# Patient Record
Sex: Female | Born: 1980 | Hispanic: No | Marital: Married | Smoking: Never smoker
Health system: Southern US, Community
[De-identification: ages and names within clinical notes are randomized; demographics above are authoritative.]

## PROBLEM LIST (undated history)

## (undated) DIAGNOSIS — K529 Noninfective gastroenteritis and colitis, unspecified: Secondary | ICD-10-CM

## (undated) DIAGNOSIS — E282 Polycystic ovarian syndrome: Secondary | ICD-10-CM

## (undated) DIAGNOSIS — K219 Gastro-esophageal reflux disease without esophagitis: Secondary | ICD-10-CM

## (undated) HISTORY — DX: Polycystic ovarian syndrome: E28.2

## (undated) HISTORY — DX: Noninfective gastroenteritis and colitis, unspecified: K52.9

## (undated) HISTORY — PX: APPENDECTOMY: SHX54

---

## 2014-03-11 HISTORY — PX: HYSTEROSCOPY: SHX211

## 2015-02-23 ENCOUNTER — Emergency Department
Admission: EM | Admit: 2015-02-23 | Discharge: 2015-02-23 | Disposition: A | Discharge status: Home / Self Care | Payer: Self-pay | Attending: Emergency Medicine | Admitting: Emergency Medicine

## 2015-02-23 ENCOUNTER — Encounter: Payer: Self-pay | Admitting: Emergency Medicine

## 2015-02-23 ENCOUNTER — Emergency Department: Payer: Self-pay

## 2015-02-23 DIAGNOSIS — R1013 Epigastric pain: Secondary | ICD-10-CM

## 2015-02-23 DIAGNOSIS — K219 Gastro-esophageal reflux disease without esophagitis: Secondary | ICD-10-CM | POA: Insufficient documentation

## 2015-02-23 DIAGNOSIS — Z3A01 Less than 8 weeks gestation of pregnancy: Secondary | ICD-10-CM | POA: Insufficient documentation

## 2015-02-23 DIAGNOSIS — O99331 Smoking (tobacco) complicating pregnancy, first trimester: Secondary | ICD-10-CM | POA: Insufficient documentation

## 2015-02-23 DIAGNOSIS — O99611 Diseases of the digestive system complicating pregnancy, first trimester: Secondary | ICD-10-CM | POA: Insufficient documentation

## 2015-02-23 DIAGNOSIS — R197 Diarrhea, unspecified: Secondary | ICD-10-CM | POA: Insufficient documentation

## 2015-02-23 DIAGNOSIS — O210 Mild hyperemesis gravidarum: Secondary | ICD-10-CM | POA: Insufficient documentation

## 2015-02-23 DIAGNOSIS — F17200 Nicotine dependence, unspecified, uncomplicated: Secondary | ICD-10-CM | POA: Insufficient documentation

## 2015-02-23 HISTORY — DX: Gastro-esophageal reflux disease without esophagitis: K21.9

## 2015-02-23 LAB — CBC WITH DIFFERENTIAL/PLATELET
BASOS ABS: 0 10*3/uL (ref 0–0.1)
Basophils Relative: 0 %
EOS PCT: 0 %
Eosinophils Absolute: 0 10*3/uL (ref 0–0.7)
HEMATOCRIT: 36.8 % (ref 35.0–47.0)
Hemoglobin: 12.5 g/dL (ref 12.0–16.0)
LYMPHS PCT: 30 %
Lymphs Abs: 1.6 10*3/uL (ref 1.0–3.6)
MCH: 31.7 pg (ref 26.0–34.0)
MCHC: 34 g/dL (ref 32.0–36.0)
MCV: 93.4 fL (ref 80.0–100.0)
Monocytes Absolute: 0.4 10*3/uL (ref 0.2–0.9)
Monocytes Relative: 7 %
NEUTROS ABS: 3.3 10*3/uL (ref 1.4–6.5)
NEUTROS PCT: 63 %
PLATELETS: 211 10*3/uL (ref 150–440)
RBC: 3.94 MIL/uL (ref 3.80–5.20)
RDW: 12.6 % (ref 11.5–14.5)
WBC: 5.2 10*3/uL (ref 3.6–11.0)

## 2015-02-23 LAB — COMPREHENSIVE METABOLIC PANEL
ALT: 13 U/L — AB (ref 14–54)
AST: 22 U/L (ref 15–41)
Albumin: 4.1 g/dL (ref 3.5–5.0)
Alkaline Phosphatase: 39 U/L (ref 38–126)
Anion gap: 6 (ref 5–15)
BUN: 11 mg/dL (ref 6–20)
CHLORIDE: 106 mmol/L (ref 101–111)
CO2: 24 mmol/L (ref 22–32)
CREATININE: 0.47 mg/dL (ref 0.44–1.00)
Calcium: 9.1 mg/dL (ref 8.9–10.3)
GFR calc Af Amer: >60 mL/min (ref >60)
Glucose, Bld: 93 mg/dL (ref 65–99)
Potassium: 4 mmol/L (ref 3.5–5.1)
Sodium: 136 mmol/L (ref 135–145)
Total Bilirubin: 1.2 mg/dL (ref 0.3–1.2)
Total Protein: 7.7 g/dL (ref 6.5–8.1)

## 2015-02-23 LAB — URINALYSIS COMPLETE WITH MICROSCOPIC (ARMC ONLY)
BILIRUBIN URINE: NEGATIVE
Glucose, UA: NEGATIVE mg/dL
Hgb urine dipstick: NEGATIVE
Ketones, ur: NEGATIVE mg/dL
Leukocytes, UA: NEGATIVE
Nitrite: NEGATIVE
PH: 8 (ref 5.0–8.0)
PROTEIN: NEGATIVE mg/dL
RBC / HPF: NONE SEEN RBC/hpf (ref 0–5)
Specific Gravity, Urine: 1.013 (ref 1.005–1.030)

## 2015-02-23 LAB — LIPASE, BLOOD: LIPASE: 23 U/L (ref 11–51)

## 2015-02-23 MED ORDER — GI COCKTAIL ~~LOC~~: 30.0000 mL | Freq: Once | ORAL | Status: DC

## 2015-02-23 MED ORDER — PROMETHAZINE HCL 12.5 MG PO TABS: 12.5000 mg | ORAL_TABLET | Freq: Four times a day (QID) | ORAL | Status: AC | PRN

## 2015-02-23 MED ORDER — DICYCLOMINE HCL 20 MG PO TABS: 20.0000 mg | ORAL_TABLET | Freq: Three times a day (TID) | ORAL | Status: DC | PRN

## 2015-02-23 MED ORDER — PANTOPRAZOLE SODIUM 20 MG PO TBEC: 20.0000 mg | DELAYED_RELEASE_TABLET | Freq: Every day | ORAL | Status: DC

## 2015-02-23 MED ORDER — ONDANSETRON 4 MG PO TBDP
4.0000 mg | ORAL_TABLET | Freq: Once | ORAL | Status: DC
Start: 2015-02-23 — End: 2015-02-23

## 2015-02-23 NOTE — ED Notes (Signed)
EDP at bedside  

## 2015-02-23 NOTE — ED Notes (Signed)
Unable to access signature screen to have patient sign for dc instructions because it is in read-only mode.

## 2015-02-23 NOTE — ED Notes (Signed)
Pt transported to [facility]

## 2015-02-23 NOTE — ED Notes (Signed)
Pt presents with epigastric pain for ten days now. States certain foods make it worse, pt with hx of gerd. Pt is currently two weeks pregnant and was seen earlier in the week by pcp and given ranitidine to take. Pt states is not any better.

## 2015-02-23 NOTE — ED Notes (Signed)
Given crackers and water ?

## 2015-02-23 NOTE — ED Provider Notes (Signed)
Time Seen: Approximately ----------------------------------------- 12:32 PM on 02/23/2015 -----------------------------------------    I have reviewed the triage notes  Chief Complaint: Abdominal Pain   History of Present Illness: Christina Blankenship is a 34 y.o. female who presents with acute exacerbation of what sounds like chronic intermittent epigastric pain. Patient was recently diagnosed in her first trimester of pregnancy approximately 2-3 weeks and was referred to OB/GYN. She states she's had a history of colitis and points to the epigastric area as the source of her discomfort. She states she also has some loose stool with no melena or hematochezia. Patient denies any fever or chills back or flank discomfort. She has had some nausea and some reflux-type symptoms.   Past Medical History  Diagnosis Date  . GERD (gastroesophageal reflux disease)     There are no active problems to display for this patient.   Past Surgical History  Procedure Laterality Date  . Appendectomy      Past Surgical History  Procedure Laterality Date  . Appendectomy      No current outpatient prescriptions on file.  Allergies:  Tramadol  Family History: No family history on file.  Social History: Social History  Substance Use Topics  . Smoking status: Current Every Day Smoker  . Smokeless tobacco: None  . Alcohol Use: No     Review of Systems:   10 point review of systems was performed and was otherwise negative:  Constitutional: No fever Eyes: No visual disturbances ENT: No sore throat, ear pain Cardiac: No chest pain Respiratory: No shortness of breath, wheezing, or stridor Abdomen: Abdominal pains exclusively in the epigastric area and seems to wax and wane in its intensity. Endocrine: No weight loss, No night sweats Extremities: No peripheral edema, cyanosis Skin: No rashes, easy bruising Neurologic: No focal weakness, trouble with speech or swollowing Urologic:  No dysuria, Hematuria, or urinary frequency   Physical Exam:  ED Triage Vitals  Enc Vitals Group     BP 02/23/15 1129 106/54 mmHg     Pulse Rate 02/23/15 1129 93     Resp 02/23/15 1129 18     Temp 02/23/15 1129 98 F (36.7 C)     Temp Source 02/23/15 1129 Oral     SpO2 02/23/15 1129 100 %     Weight 02/23/15 1129 123 lb (55.792 kg)     Height 02/23/15 1129 5\' 5"  (1.651 m)     Head Cir --      Peak Flow --      Pain Score 02/23/15 1129 6     Pain Loc --      Pain Edu? --      Excl. in GC? --     General: Awake , Alert , and Oriented times 3; GCS 15 Head: Normal cephalic , atraumatic Eyes: Pupils equal , round, reactive to light Nose/Throat: No nasal drainage, patent upper airway without erythema or exudate.  Neck: Supple, Full range of motion, No anterior adenopathy or palpable thyroid masses Lungs: Clear to ascultation without wheezes , rhonchi, or rales Heart: Regular rate, regular rhythm without murmurs , gallops , or rubs Abdomen: Mild tenderness to deep palpation without rebound, guarding , or rigidity; bowel sounds positive and symmetric in all 4 quadrants. No organomegaly .        Extremities: 2 plus symmetric pulses. No edema, clubbing or cyanosis Neurologic: normal ambulation, Motor symmetric without deficits, sensory intact Skin: warm, dry, no rashes   Labs:   All laboratory work was  reviewed including any pertinent negatives or positives listed below:  Labs Reviewed  CBC WITH DIFFERENTIAL/PLATELET  COMPREHENSIVE METABOLIC PANEL  LIPASE, BLOOD  URINALYSIS COMPLETEWITH MICROSCOPIC ([facility] ONLY)       Radiology:   CLINICAL DATA: Epigastric abdominal pain for 10 days.  EXAM: US ABDOMEN LIMITED - RIGHT UPPER QUADRANT  COMPARISON: None.  FINDINGS: Gallbladder:  No gallstones or wall thickening visualized. No sonographic Murphy sign noted.  Common bile duct:  Diameter: 3.6 mm which is within normal limits.  Liver:  No focal lesion  identified. Within normal limits in parenchymal echogenicity.  IMPRESSION: No abnormality seen in the right upper quadrant of the abdomen.    I personally reviewed the radiologic studies     ED Course: Differential diagnosis includes but is not exclusive to acute cholecystitis, intrathoracic causes for epigastric abdominal pain, gastritis, duodenitis, pancreatitis, small bowel or large bowel obstruction, abdominal aortic aneurysm, hernia, gastritis, etc. Given the patient's current clinical presentation and objective findings I felt she had some colitis and/or gastroenteritis. She also describes some reflux-type symptoms associated with a first trimester pregnancy. She has no pregnancy related complaints at this time such as vaginal bleeding or lower abdominal pain. Patient's workup included an ultrasound which did not show any signs of biliary colic and has some mild pain over the epigastric area. I felt was unlikely she had a bleeding ulcer the patient was referred to OB/GYN and was placed on Protonix as she states her ranitidine has not been working. He is advised take over-the-counter Tylenol for pain and return here if she develops any melena, hematochezia, fever or any other new concerns. I advised her that the loose stool likely resolve itself out at this point I felt was unlikely it was related to the meal that she had last week.    Assessment: * First trimester pregnancy Esophageal reflux disease Nausea, vomiting and diarrhea     Plan:  Outpatient management Patient was advised to return immediately if condition worsens. Patient was advised to follow up with their primary care physician or other specialized physicians involved in their outpatient care            Jennye Moccasin, MD 02/23/15 1442

## 2015-02-23 NOTE — ED Notes (Signed)
Pt returned from US

## 2015-03-22 ENCOUNTER — Encounter: Payer: Self-pay | Admitting: Obstetrics & Gynecology

## 2015-03-22 ENCOUNTER — Ambulatory Visit (INDEPENDENT_AMBULATORY_CARE_PROVIDER_SITE_OTHER): Payer: BLUE CROSS/BLUE SHIELD | Admitting: Obstetrics & Gynecology

## 2015-03-22 VITALS — BP 95/61 | HR 89 | Ht 66.0 in | Wt 120.0 lb

## 2015-03-22 DIAGNOSIS — Z3401 Encounter for supervision of normal first pregnancy, first trimester: Secondary | ICD-10-CM

## 2015-03-22 DIAGNOSIS — Z124 Encounter for screening for malignant neoplasm of cervix: Secondary | ICD-10-CM | POA: Diagnosis not present

## 2015-03-22 DIAGNOSIS — Z3400 Encounter for supervision of normal first pregnancy, unspecified trimester: Secondary | ICD-10-CM | POA: Insufficient documentation

## 2015-03-22 DIAGNOSIS — Z36 Encounter for antenatal screening of mother: Secondary | ICD-10-CM | POA: Diagnosis not present

## 2015-03-22 DIAGNOSIS — O09529 Supervision of elderly multigravida, unspecified trimester: Secondary | ICD-10-CM | POA: Insufficient documentation

## 2015-03-22 DIAGNOSIS — Z113 Encounter for screening for infections with a predominantly sexual mode of transmission: Secondary | ICD-10-CM

## 2015-03-22 DIAGNOSIS — K529 Noninfective gastroenteritis and colitis, unspecified: Secondary | ICD-10-CM

## 2015-03-22 DIAGNOSIS — Z1151 Encounter for screening for human papillomavirus (HPV): Secondary | ICD-10-CM | POA: Diagnosis not present

## 2015-03-22 DIAGNOSIS — Z23 Encounter for immunization: Secondary | ICD-10-CM | POA: Diagnosis not present

## 2015-03-22 DIAGNOSIS — O09511 Supervision of elderly primigravida, first trimester: Secondary | ICD-10-CM

## 2015-03-22 MED ORDER — OMEPRAZOLE 20 MG PO CPDR: 20.0000 mg | DELAYED_RELEASE_CAPSULE | Freq: Every day | ORAL | Status: DC

## 2015-03-22 NOTE — Progress Notes (Signed)
Bedside ultrasound today measured 78100w4d fetus with heartbeat.

## 2015-03-22 NOTE — Progress Notes (Signed)
   Subjective:    Christina Blankenship is a Dorrene GermanM [place] G1P0 5832w4d being seen today for her first obstetrical visit.  Her obstetrical history is significant for ama, "colitis". Patient does intend to breast feed. Pregnancy history fully reviewed.  Patient reports all over abdominal pain with excess acid.  Filed Vitals:   03/22/15 1050 03/22/15 1053  BP: 95/61   Pulse: 89   Height:  5\' 6"  (1.676 m)  Weight: 120 lb (54.432 kg)     HISTORY: OB History  Gravida Para Term Preterm AB SAB TAB Ectopic Multiple Living  1             # Outcome Date GA Lbr Len/2nd Weight Sex Delivery Anes PTL Lv  1 Current              Past Medical History  Diagnosis Date  . GERD (gastroesophageal reflux disease)   . Colitis   . PCOS (polycystic ovarian syndrome)    Past Surgical History  Procedure Laterality Date  . Appendectomy    . Hysteroscopy  2016   Family History  Problem Relation Age of Onset  . Diabetes Father      Exam    Uterus:     Pelvic Exam:    Perineum: No Hemorrhoids   Vulva: normal   Vagina:  normal mucosa   pH:    Cervix: anteverted   Adnexa: normal adnexa   Bony Pelvis: android  System: Breast:  normal appearance, no masses or tenderness   Skin: normal coloration and turgor, no rashes    Neurologic: oriented   Extremities: normal strength, tone, and muscle mass   HEENT PERRLA   Mouth/Teeth mucous membranes moist, pharynx normal without lesions   Neck supple   Cardiovascular: regular rate and rhythm   Respiratory:  appears well, vitals normal, no respiratory distress, acyanotic, normal RR, ear and throat exam is normal, neck free of mass or lymphadenopathy, chest clear, no wheezing, crepitations, rhonchi, normal symmetric air entry   Abdomen: soft, non-tender; bowel sounds normal; no masses,  no organomegaly   Urinary: urethral meatus normal      Assessment:    Pregnancy: G1P0 Patient Active Problem List   Diagnosis Date Noted  . Supervision of normal  first pregnancy, antepartum 03/22/2015  . AMA (advanced maternal age) multigravida 35+ 03/22/2015        Plan:     Initial labs drawn. Prenatal vitamins. Problem list reviewed and updated. Genetic Screening discussed: Panorama today  Ultrasound discussed; fetal survey: requested.  Follow up in 4 weeks. Referral to GI Prilosec 20 mg daily Offered flu vaccine today Zeppelin Commis[employer] C. 03/22/2015

## 2015-03-23 LAB — PRENATAL PROFILE (SOLSTAS)
ANTIBODY SCREEN: NEGATIVE
Basophils Absolute: 0 10*3/uL (ref 0.0–0.1)
Basophils Relative: 0 % (ref 0–1)
EOS ABS: 0 10*3/uL (ref 0.0–0.7)
Eosinophils Relative: 0 % (ref 0–5)
HCT: 38.7 % (ref 36.0–46.0)
HEMOGLOBIN: 13.1 g/dL (ref 12.0–15.0)
HIV 1&2 Ab, 4th Generation: NONREACTIVE
Hepatitis B Surface Ag: NEGATIVE
Lymphocytes Relative: 33 % (ref 12–46)
Lymphs Abs: 1.9 10*3/uL (ref 0.7–4.0)
MCH: 32 pg (ref 26.0–34.0)
MCHC: 33.9 g/dL (ref 30.0–36.0)
MCV: 94.6 fL (ref 78.0–100.0)
MONO ABS: 0.4 10*3/uL (ref 0.1–1.0)
MONOS PCT: 7 % (ref 3–12)
MPV: 9.2 fL (ref 8.6–12.4)
NEUTROS PCT: 60 % (ref 43–77)
Neutro Abs: 3.5 10*3/uL (ref 1.7–7.7)
PLATELETS: 276 10*3/uL (ref 150–400)
RBC: 4.09 MIL/uL (ref 3.87–5.11)
RDW: 13.1 % (ref 11.5–15.5)
RUBELLA: 2.86 {index} — AB (ref <0.90)
Rh Type: POSITIVE
WBC: 5.9 10*3/uL (ref 4.0–10.5)

## 2015-03-24 LAB — CULTURE, OB URINE
Colony Count: NO GROWTH
Organism ID, Bacteria: NO GROWTH

## 2015-03-28 LAB — CYTOLOGY - PAP

## 2015-03-30 ENCOUNTER — Encounter: Payer: Self-pay | Admitting: *Deleted

## 2015-04-24 ENCOUNTER — Ambulatory Visit: Payer: Self-pay | Admitting: Gastroenterology

## 2015-04-26 ENCOUNTER — Encounter: Payer: BLUE CROSS/BLUE SHIELD | Admitting: Family Medicine

## 2015-05-01 ENCOUNTER — Telehealth: Payer: Self-pay

## 2015-05-01 NOTE — Telephone Encounter (Signed)
Patient missed her ob appt, called her to attempt to reschedule appt, no answer, no way to leave a message. Patients phone gives an automated response that "Subscriber is not taking incoming calls at this time"

## 2015-06-07 ENCOUNTER — Ambulatory Visit (INDEPENDENT_AMBULATORY_CARE_PROVIDER_SITE_OTHER): Payer: BLUE CROSS/BLUE SHIELD | Admitting: Certified Nurse Midwife

## 2015-06-07 VITALS — BP 97/58 | HR 90 | Wt 124.0 lb

## 2015-06-07 DIAGNOSIS — Z3402 Encounter for supervision of normal first pregnancy, second trimester: Secondary | ICD-10-CM | POA: Diagnosis not present

## 2015-06-07 DIAGNOSIS — O09522 Supervision of elderly multigravida, second trimester: Secondary | ICD-10-CM | POA: Diagnosis not present

## 2015-06-07 NOTE — Progress Notes (Signed)
Subjective:  Christina Blankenship is a 35 y.o. G1P0 at 619w4d being seen today for ongoing prenatal care.  She is currently monitored for the following issues for this high-risk pregnancy and has Supervision of normal first pregnancy, antepartum and AMA (advanced maternal age) multigravida 35+ on her problem list.  Patient reports no complaints.  Contractions: Not present. Vag. Bleeding: None.  Movement: Present. Denies leaking of fluid.   The following portions of the patient's history were reviewed and updated as appropriate: allergies, current medications, past family history, past medical history, past social history, past surgical history and problem list. Problem list updated.  Objective:   Filed Vitals:   06/07/15 1611  BP: 97/58  Pulse: 90  Weight: 124 lb (56.246 kg)    Fetal Status: Fetal Heart Rate (bpm): 156    Movement: Present     General:  Alert, oriented and cooperative. Patient is in no acute distress.  Skin: Skin is warm and dry. No rash noted.   Cardiovascular: Normal heart rate noted  Respiratory: Normal respiratory effort, no problems with respiration noted  Abdomen: Soft, gravid, appropriate for gestational age. Pain/Pressure: Absent     Pelvic: Vag. Bleeding: None Vag D/C Character: White   Cervical exam deferred        Extremities: Normal range of motion.  Edema: None  Mental Status: Normal mood and affect. Normal behavior. Normal judgment and thought content.   Urinalysis: Urine Protein: Trace Urine Glucose: Negative  Assessment and Plan:  Pregnancy: G1P0 at 539w4d  1. AMA (advanced maternal age) multigravida 35+, second trimester  - US MFM OB COMP + 14 WK; Future  2. Supervision of normal first pregnancy, antepartum, second trimester  - US MFM OB COMP + 14 WK; Future  Preterm labor symptoms and general obstetric precautions including but not limited to vaginal bleeding, contractions, leaking of fluid and fetal movement were reviewed in detail with the  patient. Please refer to After Visit Summary for other counseling recommendations.  Return in about 4 weeks (around 07/05/2015) for 1 hour gtt.   Rhea PinkLori A Regla Fitzgibbon, CNM

## 2015-06-07 NOTE — Patient Instructions (Addendum)
Second Trimester of Pregnancy The second trimester is from week 13 through week 28, months 4 through 6. The second trimester is often a time when you feel your best. Your body has also adjusted to being pregnant, and you begin to feel better physically. Usually, morning sickness has lessened or quit completely, you may have more energy, and you may have an increase in appetite. The second trimester is also a time when the fetus is growing rapidly. At the end of the sixth month, the fetus is about 9 inches long and weighs about 1 pounds. You will likely begin to feel the baby move (quickening) between 18 and 20 weeks of the pregnancy. BODY CHANGES Your body goes through many changes during pregnancy. The changes vary from woman to woman.   Your weight will continue to increase. You will notice your lower abdomen bulging out.  You may begin to get stretch marks on your hips, abdomen, and breasts.  You may develop headaches that can be relieved by medicines approved by your health care provider.  You may urinate more often because the fetus is pressing on your bladder.  You may develop or continue to have heartburn as a result of your pregnancy.  You may develop constipation because certain hormones are causing the muscles that push waste through your intestines to slow down.  You may develop hemorrhoids or swollen, bulging veins (varicose veins).  You may have back pain because of the weight gain and pregnancy hormones relaxing your joints between the bones in your pelvis and as a result of a shift in weight and the muscles that support your balance.  Your breasts will continue to grow and be tender.  Your gums may bleed and may be sensitive to brushing and flossing.  Dark spots or blotches (chloasma, mask of pregnancy) may develop on your face. This will likely fade after the baby is born.  A dark line from your belly button to the pubic area (linea nigra) may appear. This will likely fade  after the baby is born.  You may have changes in your hair. These can include thickening of your hair, rapid growth, and changes in texture. Some women also have hair loss during or after pregnancy, or hair that feels dry or thin. Your hair will most likely return to normal after your baby is born. WHAT TO EXPECT AT YOUR PRENATAL VISITS During a routine prenatal visit:  You will be weighed to make sure you and the fetus are growing normally.  Your blood pressure will be taken.  Your abdomen will be measured to track your baby's growth.  The fetal heartbeat will be listened to.  Any test results from the previous visit will be discussed. Your health care provider may ask you:  How you are feeling.  If you are feeling the baby move.  If you have had any abnormal symptoms, such as leaking fluid, bleeding, severe headaches, or abdominal cramping.  If you are using any tobacco products, including cigarettes, chewing tobacco, and electronic cigarettes.  If you have any questions. Other tests that may be performed during your second trimester include:  Blood tests that check for:  Low iron levels (anemia).  Gestational diabetes (between 24 and 28 weeks).  Rh antibodies.  Urine tests to check for infections, diabetes, or protein in the urine.  An ultrasound to confirm the proper growth and development of the baby.  An amniocentesis to check for possible genetic problems.  Fetal screens for spina bifida   and Down syndrome.  HIV (human immunodeficiency virus) testing. Routine prenatal testing includes screening for HIV, unless you choose not to have this test. HOME CARE INSTRUCTIONS   Avoid all smoking, herbs, alcohol, and unprescribed drugs. These chemicals affect the formation and growth of the baby.  Do not use any tobacco products, including cigarettes, chewing tobacco, and electronic cigarettes. If you need help quitting, ask your health care provider. You may receive  counseling support and other resources to help you quit.  Follow your health care provider's instructions regarding medicine use. There are medicines that are either safe or unsafe to take during pregnancy.  Exercise only as directed by your health care provider. Experiencing uterine cramps is a good sign to stop exercising.  Continue to eat regular, healthy meals.  Wear a good support bra for breast tenderness.  Do not use hot tubs, steam rooms, or saunas.  Wear your seat belt at all times when driving.  Avoid raw meat, uncooked cheese, cat litter boxes, and soil used by cats. These carry germs that can cause birth defects in the baby.  Take your prenatal vitamins.  Take 1500-2000 mg of calcium daily starting at the 20th week of pregnancy until you deliver your baby.  Try taking a stool softener (if your health care provider approves) if you develop constipation. Eat more high-fiber foods, such as fresh vegetables or fruit and whole grains. Drink plenty of fluids to keep your urine clear or pale yellow.  Take warm sitz baths to soothe any pain or discomfort caused by hemorrhoids. Use hemorrhoid cream if your health care provider approves.  If you develop varicose veins, wear support hose. Elevate your feet for 15 minutes, 3-4 times a day. Limit salt in your diet.  Avoid heavy lifting, wear low heel shoes, and practice good posture.  Rest with your legs elevated if you have leg cramps or low back pain.  Visit your dentist if you have not gone yet during your pregnancy. Use a soft toothbrush to brush your teeth and be gentle when you floss.  A sexual relationship may be continued unless your health care provider directs you otherwise.  Continue to go to all your prenatal visits as directed by your health care provider. SEEK MEDICAL CARE IF:   You have dizziness.  You have mild pelvic cramps, pelvic pressure, or nagging pain in the abdominal area.  You have persistent nausea,  vomiting, or diarrhea.  You have a bad smelling vaginal discharge.  You have pain with urination. SEEK IMMEDIATE MEDICAL CARE IF:   You have a fever.  You are leaking fluid from your vagina.  You have spotting or bleeding from your vagina.  You have severe abdominal cramping or pain.  You have rapid weight gain or loss.  You have shortness of breath with chest pain.  You notice sudden or extreme swelling of your face, hands, ankles, feet, or legs.  You have not felt your baby move in over an hour.  You have severe headaches that do not go away with medicine.  You have vision changes.   This information is not intended to replace advice given to you by your health care provider. Make sure you discuss any questions you have with your health care provider.   Document Released: 02/19/2001 Document Revised: 03/18/2014 Document Reviewed: 04/28/2012 Elsevier Interactive Patient Education 2016 [employer] Glucose Tolerance Test During Pregnancy The glucose tolerance test (GTT) is a blood test used to determine if you have developed a   type of diabetes during pregnancy (gestational diabetes). This is when your body does not properly process sugar (glucose) in the food you eat, resulting in high blood glucose levels. Typically, a GTT is done after you have had a 1-hour glucose test with results that indicate you possibly have gestational diabetes. It may also be done if:  You have a history of giving birth to very large babies or have experienced repeated fetal loss (stillbirth).   You have signs and symptoms of diabetes, such as:   Changes in your vision.   Tingling or numbness in your hands or feet.   Changes in hunger, thirst, and urination not otherwise explained by your pregnancy.  The GTT lasts about 3 hours. You will be given a sugar-water solution to drink at the beginning of the test. You will have blood drawn before you drink the solution and then again 1, 2, and 3  hours after you drink it. You will not be allowed to eat or drink anything else during the test. You must remain at the testing location to make sure that your blood is drawn on time. You should also avoid exercising during the test, because exercise can alter test results. PREPARATION FOR TEST  Eat normally for 3 days prior to the GTT test, including having plenty of carbohydrate-rich foods. Do not eat or drink anything except water during the final 12 hours before the test. In addition, your health care provider may ask you to stop taking certain medicines before the test. RESULTS  It is your responsibility to obtain your test results. Ask the lab or department performing the test when and how you will get your results. Contact your health care provider to discuss any questions you have about your results.  Range of Normal Values Ranges for normal values may vary among different labs and hospitals. You should always check with your health care provider after having lab work or other tests done to discuss whether your values are considered within normal limits. Normal levels of blood glucose are as follows:  Fasting: less than 105 mg/dL.   1 hour after drinking the solution: less than 190 mg/dL.   2 hours after drinking the solution: less than 165 mg/dL.   3 hours after drinking the solution: less than 145 mg/dL.  Some substances can interfere with GTT results. These may include:  Blood pressure and heart failure medicines, including beta blockers, furosemide, and thiazides.   Anti-inflammatory medicines, including aspirin.   Nicotine.   Some psychiatric medicines.  Meaning of Results Outside Normal Value Ranges GTT test results that are above normal values may indicate a number of health problems, such as:   Gestational diabetes.   Acute stress response.   Cushing syndrome.   Tumors such as pheochromocytoma or glucagonoma.   Long-term kidney problems.    Pancreatitis.   Hyperthyroidism.   Current infection.  Discuss your test results with your health care provider. He or she will use the results to make a diagnosis and determine a treatment plan that is right for you.   This information is not intended to replace advice given to you by your health care provider. Make sure you discuss any questions you have with your health care provider.   Document Released: 08/27/2011 Document Revised: 03/18/2014 Document Reviewed: 07/02/2013 Elsevier Interactive Patient Education 2016 [employer]  

## 2015-06-08 ENCOUNTER — Ambulatory Visit (HOSPITAL_COMMUNITY)
Admission: RE | Admit: 2015-06-08 | Discharge: 2015-06-08 | Disposition: A | Discharge status: Home / Self Care | Payer: BLUE CROSS/BLUE SHIELD | Source: Ambulatory Visit | Attending: Certified Nurse Midwife | Admitting: Certified Nurse Midwife

## 2015-06-08 ENCOUNTER — Other Ambulatory Visit: Payer: Self-pay | Admitting: Certified Nurse Midwife

## 2015-06-08 DIAGNOSIS — O09512 Supervision of elderly primigravida, second trimester: Secondary | ICD-10-CM | POA: Diagnosis present

## 2015-06-08 DIAGNOSIS — O09522 Supervision of elderly multigravida, second trimester: Secondary | ICD-10-CM

## 2015-06-08 DIAGNOSIS — Z3A23 23 weeks gestation of pregnancy: Secondary | ICD-10-CM

## 2015-06-08 DIAGNOSIS — Z36 Encounter for antenatal screening of mother: Secondary | ICD-10-CM | POA: Insufficient documentation

## 2015-06-08 DIAGNOSIS — Z3402 Encounter for supervision of normal first pregnancy, second trimester: Secondary | ICD-10-CM

## 2015-06-08 DIAGNOSIS — Z3689 Encounter for other specified antenatal screening: Secondary | ICD-10-CM

## 2015-07-04 ENCOUNTER — Ambulatory Visit (INDEPENDENT_AMBULATORY_CARE_PROVIDER_SITE_OTHER): Payer: BLUE CROSS/BLUE SHIELD | Admitting: Obstetrics & Gynecology

## 2015-07-04 VITALS — BP 96/59 | HR 88 | Wt 127.0 lb

## 2015-07-04 DIAGNOSIS — Z3402 Encounter for supervision of normal first pregnancy, second trimester: Secondary | ICD-10-CM | POA: Diagnosis not present

## 2015-07-04 NOTE — Patient Instructions (Addendum)
Vacuna Tdap (contra la difteria, el ttanos y [place]): Lo que debe saber (Tdap Vaccine [Tetanus, Diphtheria, and Pertussis]: What You Need to Know) 1. Por qu vacunarse? El ttanos, la difteria y la tosferina son enfermedades muy graves. La vacuna Tdap nos puede proteger de estas enfermedades. Adems, la vacuna Tdap que se aplica a las Chemical engineer a los bebs recin nacidos contra la tosferina. En la actualidad, el [place] (trismo) es una enfermedad poco frecuente en los [place]. Provoca la contraccin y el endurecimiento dolorosos de los msculos, por lo general, de todo el cuerpo.  Puede causar el endurecimiento de los msculos de la cabeza y el cuello, de modo que impide abrir la boca, tragar y en algunos casos, Ambulance person. El ttanos causa la muerte de aproximadamente 1de cada 10personas que contraen la infeccin, incluso despus de que reciben la mejor atencin mdica. La DIFTERIA tambin es poco frecuente en los [place] [employer]. Puede causar la formacin de una membrana gruesa en la parte posterior de la garganta.  Esto tiene como consecuencia problemas respiratorios, insuficiencia cardaca, parlisis y [place]. La TOSFERINA (tos convulsa) provoca episodios de tos intensa que pueden dificultar la respiracin y provocar vmitos y trastornos del sueo.  Tambin puede causar prdida de peso, incontinencia y fractura de [place]. Dos de cada 100 adolescentes y 5 de cada 100 adultos con tosferina deben ser hospitalizados o tienen complicaciones, que podran incluir neumona y [place]. Estas enfermedades son provocadas por bacterias. La difteria y la tosferina se contagian de Ardelia Mems persona a otra a travs de las secreciones de la tos o el estornudo. El ttanos ingresa al organismo a travs de cortes, rasguos o heridas. Antes de las vacunas, en los [place] se informaban 200000 casos de difteria, 200000 casos de tosferina y cientos de casos de ttanos cada  ao. Desde el inicio de la vacunacin, los informes de casos de ttanos y difteria han disminuido alrededor del 99%, y de tosferina, alrededor del 80%. 2. Edward Jolly Tdap La vacuna Tdap protege a adolescentes y adultos contra el ttanos, la difteria y la tosferina. Una dosis de Tdap se administra a los 80 o 12 aos. Las [place] [employer] no recibieron la vacuna Tdap a esa edad deben recibirla tan pronto como sea posible. Es muy importante que los mdicos y todos aquellos que tengan contacto cercano con bebs menores de 62meses reciban la vacuna Tdap. Las mujeres deben recibir una dosis de Tdap en cada Media planner, para proteger al recin nacido de la tosferina. Los nios tienen mayor riesgo de complicaciones graves y potencialmente mortales debido a la tosferina. Otra vacuna llamada Td protege contra el ttanos y la difteria, pero no contra la tosferina. Todos deben recibir una dosis de refuerzo de Td cada 10 aos. La Tdap puede aplicarse como uno de estos refuerzos si nunca antes recibi esta vacuna. Tambin se puede aplicar despus de un corte o quemadura grave para prevenir la infeccin por ttanos. El mdico o la persona que le aplique la vacuna puede darle ms informacin al [employer]. La Tdap puede administrarse de manera segura simultneamente con otras vacunas. 3. Algunas personas no deben recibir la [employer] persona que alguna vez tuvo una reaccin alrgica potencialmente mortal a Ardelia Mems dosis previa de cualquier vacuna contra el ttanos, la difteria o la tosferina, O que tenga una alergia grave a cualquiera de los componentes de esta vacuna, no debe recibir la vacuna Tdap. Informe a la Web designer la vacuna si tiene  cualquier alergia grave.  Una persona que estuvo en estado de coma o sufri mltiples convulsiones en el trmino de los 7das despus de recibir una dosis de DTP o DTaP, o una dosis previa de Tdap, no debe recibir la vacuna Tdap, salvo que se haya encontrado otra causa que no fuera  la vacuna. An puede recibir la Td.  Consulte con su mdico si:  tiene convulsiones u otro problema del sistema nervioso,  tuvo hinchazn o dolor intenso despus de cualquier vacuna contra la difteria o el ttanos,  alguna vez ha sufrido el sndrome de [place],  no se siente Pharmacologist en que se ha programado la vacuna. 4. Riesgos Con cualquier medicamento, incluyendo las vacunas, existe la posibilidad de que aparezcan efectos secundarios. Suelen ser leves y desaparecen por s solos. Tambin son posibles las reacciones graves, pero en raras ocasiones. La [employer] de las personas a las que se les aplica la vacuna Tdap no tienen ningn problema. Problemas leves despus de la vacuna Tdap (No interfirieron en otras actividades)  Dolor en el lugar donde se aplic la vacuna (alrededor de 3 de cada 4 adolescentes o 2 de cada 3 adultos).  Enrojecimiento o hinchazn en el lugar donde se aplic la vacuna (1 de cada 5 personas).  Fiebre leve de al menos 100,21F (38C) (hasta alrededor de 1 cada 25 adolescentes o 1 de cada 100 adultos).  Dolor de cabeza (alrededor de 3 o 4 de cada 10 personas).  Cansancio (alrededor de 1 de cada 3 o 4 personas).  Nuseas, vmitos, diarrea, dolor de estmago (hasta 1 de cada 4 adolescentes o 1 de cada 10 adultos).  Escalofros, dolores articulares (alrededor de 1de cada 10personas).  Dolores corporales (alrededor de 1de cada 3 o 4personas).  Erupcin cutnea, inflamacin de los ganglios (poco frecuente). Problemas moderados despus de recibir la vacuna Tdap (Interfirieron en otras actividades, pero no requirieron atencin mdica)  Management consultant donde se aplic la vacuna (hasta 1de cada 5 o 6).  Enrojecimiento o inflamacin en el lugar donde se aplic la vacuna (hasta alrededor de 1 de cada 16adolescentes o 1 de cada 12adultos).  Fiebre de ms de 102F (38,8C) (alrededor de 1 de cada 100 adolescentes o 1 de cada 250  adultos).  Dolor de cabeza (alrededor de 1de cada 7adolescentes o 1de cada 10adultos).  Nuseas, vmitos, diarrea, dolor de estmago (hasta 1 o 3 de cada 100 personas).  Hinchazn de todo el brazo en el que se aplic la vacuna (hasta alrededor de 1de cada 500personas). Problemas graves despus de la vacuna Tdap (Impidieron Optometrist las actividades habituales; requirieron atencin mdica)  Inflamacin, dolor intenso, sangrado y enrojecimiento en el brazo en que se aplic la vacuna (poco frecuente). Problemas que podran ocurrir despus de cualquier vacuna:  Las personas a veces se desmayan despus de un procedimiento mdico, incluida la vacunacin. Si permanece sentado o recostado durante 15 minutos puede ayudar a [employer] y las lesiones causadas por las cadas. Informe al mdico si se siente mareado, tiene cambios en la visin o zumbidos en los odos.  Algunas personas sienten un dolor intenso en el hombro y tienen dificultad para mover el brazo donde se coloc la vacuna. Esto sucede con muy poca frecuencia.  Cualquier medicamento puede causar una reaccin alrgica grave. Dichas reacciones son Orlene Erm poco frecuentes con una vacuna (se calcula que menos de 1en un milln de dosis) y se producen de unos minutos a unas horas despus de Writer. Al  igual que con cualquier [employer], existe una probabilidad muy remota de que una vacuna cause una lesin grave o la [place]. Se controla permanentemente la seguridad de las vacunas. Para obtener ms informacin, visite: http://floyd.org/. 5. Qu pasa si hay un problema grave? A qu signos debo estar atento?  Observe todo lo que le preocupe, como signos de una reaccin alrgica grave, fiebre muy alta o comportamiento fuera de lo normal.  Los signos de una reaccin alrgica grave pueden incluir ronchas, hinchazn de la cara y la garganta, dificultad para respirar, latidos cardacos acelerados, mareos y debilidad.  Generalmente, estos comenzaran entre unos pocos minutos y algunas horas despus de la vacunacin. Qu debo hacer?  Si usted piensa que se trata de una reaccin alrgica grave o de otra emergencia que no puede esperar, llame al 911 o lleve a la persona al hospital ms cercano. Sino, llame a su mdico.  Despus, la reaccin debe informarse al [address] de Informacin sobre Efectos Adversos de las [place] (Vaccine Adverse Event Reporting System, VAERS). Su mdico puede presentar este informe, o puede hacerlo usted mismo a travs del sitio web de VAERS, en www.vaers.LAgents.no, o llamando al [phone]. VAERS no brinda recomendaciones mdicas. 6. [employer] de Compensacin de Daos por [employer] [place] de Compensacin de Daos por Administrator, arts (National Vaccine Injury Compensation Program, VICP) es un programa federal que fue creado para Patent examiner a las personas que puedan haber sufrido daos al recibir ciertas vacunas. Aquellas personas que consideren que han sufrido un dao como consecuencia de una vacuna y [place] saber ms acerca del programa y de cmo presentar Roslynn Amble, pueden llamar al [phone] o visitar su sitio web en SpiritualWord.at. Hay un lmite de tiempo para presentar un reclamo de compensacin. 7. Cmo puedo obtener ms informacin?  Consulte a su mdico. Este puede darle el prospecto de la vacuna o recomendarle otras fuentes de informacin.  Comunquese con el servicio de salud de su localidad o [address].  Comunquese con los Centros para Air traffic controller y la Prevencin de Child psychotherapist for Disease Control and Prevention , CDC).  Llame al 9307028032 (1-800-CDC-INFO), o  visite el sitio web [employer] en PicCapture.uy. Declaracin de informacin sobre la vacuna contra la difteria, el ttanos y la tosferina (Tdap) de los CDC (24/02/15)   Esta informacin no tiene Theme park manager el consejo del mdico. Asegrese de hacerle  al mdico cualquier pregunta que tenga.   Document Released: 02/12/2012 Document Revised: 03/18/2014 Elsevier Interactive Patient Education [employer]  Tdap Vaccine (Tetanus, Diphtheria and Pertussis): What You Need to Know 1. Why get vaccinated? Tetanus, diphtheria and pertussis are very serious diseases. Tdap vaccine can protect [place] from these diseases. And, Tdap vaccine given to pregnant women can protect newborn babies against pertussis. TETANUS (Lockjaw) is rare in the [place] today. It causes painful muscle tightening and stiffness, usually all over the body.  It can lead to tightening of muscles in the head and neck so you can't open your mouth, swallow, or sometimes even breathe. Tetanus kills about 1 out of 10 people who are infected even after receiving the best medical care. DIPHTHERIA is also rare in the [place] today. It can cause a thick coating to form in the back of the throat.  It can lead to breathing problems, heart failure, paralysis, and death. PERTUSSIS (Whooping Cough) causes severe coughing spells, which can cause difficulty breathing, vomiting and disturbed sleep.  It can also lead to weight loss, incontinence, and  rib fractures. Up to 2 in 100 adolescents and 5 in 100 adults with pertussis are hospitalized or have complications, which could include pneumonia or death. These diseases are caused by bacteria. Diphtheria and pertussis are spread from person to person through secretions from coughing or sneezing. Tetanus enters the body through cuts, scratches, or wounds. Before vaccines, as many as 200,000 cases of diphtheria, 200,000 cases of pertussis, and hundreds of cases of tetanus, were reported in the [place] each year. Since vaccination began, reports of cases for tetanus and diphtheria have dropped by about 99% and for pertussis by about 80%. 2. Tdap vaccine Tdap vaccine can protect adolescents and adults from tetanus, diphtheria, and  pertussis. One dose of Tdap is routinely given at age 57 or 77. People who did not get Tdap at that age should get it as soon as possible. Tdap is especially important for healthcare professionals and anyone having close contact with a baby younger than 12 months. Pregnant women should get a dose of Tdap during every pregnancy, to protect the newborn from pertussis. Infants are most at risk for severe, life-threatening complications from pertussis. Another vaccine, called Td, protects against tetanus and diphtheria, but not pertussis. A Td booster should be given every 10 years. Tdap may be given as one of these boosters if you have never gotten Tdap before. Tdap may also be given after a severe cut or burn to prevent tetanus infection. Your doctor or the person giving you the vaccine can give you more information. Tdap may safely be given at the same time as other vaccines. 3. Some people should not get this vaccine  A person who has ever had a life-threatening allergic reaction after a previous dose of any diphtheria, tetanus or pertussis containing vaccine, OR has a severe allergy to any part of this vaccine, should not get Tdap vaccine. Tell the person giving the vaccine about any severe allergies.  Anyone who had coma or long repeated seizures within 7 days after a childhood dose of DTP or DTaP, or a previous dose of Tdap, should not get Tdap, unless a cause other than the vaccine was found. They can still get Td.  Talk to your doctor if you:  have seizures or another nervous system problem,  had severe pain or swelling after any vaccine containing diphtheria, tetanus or pertussis,  ever had a condition called Guillain-Barr Syndrome (GBS),  aren't feeling well on the day the shot is scheduled. 4. Risks With any medicine, including vaccines, there is a chance of side effects. These are usually mild and go away on their own. Serious reactions are also possible but are rare. Most people who  get Tdap vaccine do not have any problems with it. Mild problems following Tdap (Did not interfere with activities)  Pain where the shot was given (about 3 in 4 adolescents or 2 in 3 adults)  Redness or swelling where the shot was given (about 1 person in 5)  Mild fever of at least 100.64F (up to about 1 in 25 adolescents or 1 in 100 adults)  Headache (about 3 or 4 people in 10)  Tiredness (about 1 person in 3 or 4)  Nausea, vomiting, diarrhea, stomach ache (up to 1 in 4 adolescents or 1 in 10 adults)  Chills, sore joints (about 1 person in 10)  Body aches (about 1 person in 3 or 4)  Rash, swollen glands (uncommon) Moderate problems following Tdap (Interfered with activities, but did not require medical attention)  Pain where the shot was given (up to 1 in 5 or 6)  Redness or swelling where the shot was given (up to about 1 in 16 adolescents or 1 in 12 adults)  Fever over 102F (about 1 in 100 adolescents or 1 in 250 adults)  Headache (about 1 in 7 adolescents or 1 in 10 adults)  Nausea, vomiting, diarrhea, stomach ache (up to 1 or 3 people in 100)  Swelling of the entire arm where the shot was given (up to about 1 in 500). Severe problems following Tdap (Unable to perform usual activities; required medical attention)  Swelling, severe pain, bleeding and redness in the arm where the shot was given (rare). Problems that could happen after any vaccine:  People sometimes faint after a medical procedure, including vaccination. Sitting or lying down for about 15 minutes can help prevent fainting, and injuries caused by a fall. Tell your doctor if you feel dizzy, or have vision changes or ringing in the ears.  Some people get severe pain in the shoulder and have difficulty moving the arm where a shot was given. This happens very rarely.  Any medication can cause a severe allergic reaction. Such reactions from a vaccine are very rare, estimated at fewer than 1 in a million  doses, and would happen within a few minutes to a few hours after the vaccination. As with any medicine, there is a very remote chance of a vaccine causing a serious injury or death. The safety of vaccines is always being monitored. For more information, visit: http://floyd.org/www.cdc.gov/vaccinesafety/ 5. What if there is a serious problem? What should I look for?  Look for anything that concerns you, such as signs of a severe allergic reaction, very high fever, or unusual behavior.  Signs of a severe allergic reaction can include hives, swelling of the face and throat, difficulty breathing, a fast heartbeat, dizziness, and weakness. These would usually start a few minutes to a few hours after the vaccination. What should I do?  If you think it is a severe allergic reaction or other emergency that can't wait, call 9-1-1 or get the person to the nearest hospital. Otherwise, call your doctor.  Afterward, the reaction should be reported to the Vaccine Adverse Event Reporting System (VAERS). Your doctor might file this report, or you can do it yourself through the VAERS web site at www.vaers.LAgents.nohhs.gov, or by calling 1-442-579-2722. VAERS does not give medical advice.  6. The National Vaccine Injury Compensation Program The [employer] Vaccine Injury Compensation Program (VICP) is a federal program that was created to compensate people who may have been injured by certain vaccines. Persons who believe they may have been injured by a vaccine can learn about the program and about filing a claim by calling 1-775-300-9258 or visiting the VICP website at SpiritualWord.atwww.hrsa.gov/vaccinecompensation. There is a time limit to file a claim for compensation. 7. How can I learn more?  Ask your doctor. He or she can give you the vaccine package insert or suggest other sources of information.  Call your local or state health department.  Contact the Centers for Disease Control and Prevention (CDC):  Call (212)404-26771-402-838-3668 (1-800-CDC-INFO)  or  Visit CDC's website at PicCapture.uywww.cdc.gov/vaccines CDC Tdap Vaccine VIS (05/04/13)   This information is not intended to replace advice given to you by your health care provider. Make sure you discuss any questions you have with your health care provider.   Document Released: 08/27/2011 Document Revised: 03/18/2014 Document Reviewed: 06/09/2013 Elsevier Interactive Patient Education 2016  Elsevier Inc.  AREA PEDIATRIC/FAMILY PRACTICE PHYSICIANS   [facility] [address] [place], [place]  [address] Phone - [phone]   Fax - [phone]   [facility] PEDIATRICS

## 2015-07-04 NOTE — Progress Notes (Signed)
Subjective:  Christina Blankenship is a 35 y.o. G1P0 at 5233w3d being seen today for ongoing prenatal care.  She is currently monitored for the following issues for this low-risk pregnancy and has Supervision of normal first pregnancy, antepartum and AMA (advanced maternal age) multigravida 35+ on her problem list.  Patient reports no complaints.  Contractions: Not present. Vag. Bleeding: None.  Movement: Present. Denies leaking of fluid.   The following portions of the patient's history were reviewed and updated as appropriate: allergies, current medications, past family history, past medical history, past social history, past surgical history and problem list. Problem list updated.  Objective:   Filed Vitals:   07/04/15 1558  BP: 96/59  Pulse: 88  Weight: 127 lb (57.607 kg)    Fetal Status: Fetal Heart Rate (bpm): 158 Fundal Height: 27 cm Movement: Present     General:  Alert, oriented and cooperative. Patient is in no acute distress.  Skin: Skin is warm and dry. No rash noted.   Cardiovascular: Normal heart rate noted  Respiratory: Normal respiratory effort, no problems with respiration noted  Abdomen: Soft, gravid, appropriate for gestational age. Pain/Pressure: Absent     Pelvic: Vag. Bleeding: None Vag D/C Character: Thin  Cervical exam deferred        Extremities: Normal range of motion.  Edema: Trace  Mental Status: Normal mood and affect. Normal behavior. Normal judgment and thought content.   Urinalysis: Urine Protein: Negative Urine Glucose: Negative  Assessment and Plan:  Pregnancy: G1P0 at 9233w3d  1. Supervision of normal first pregnancy, antepartum, second trimester Preterm labor symptoms and general obstetric precautions including but not limited to vaginal bleeding, contractions, leaking of fluid and fetal movement were reviewed in detail with the patient. Please refer to After Visit Summary for other counseling recommendations.  Return in about 2 weeks (around  07/18/2015) for 1 hr GTT, 3rd trimester labs, TDap, OB Visit.   Tereso NewcomerUgonna A Myah Guynes, MD

## 2015-07-18 ENCOUNTER — Ambulatory Visit (INDEPENDENT_AMBULATORY_CARE_PROVIDER_SITE_OTHER): Payer: BLUE CROSS/BLUE SHIELD | Admitting: Family Medicine

## 2015-07-18 VITALS — BP 100/63 | HR 85 | Wt 130.0 lb

## 2015-07-18 DIAGNOSIS — Z36 Encounter for antenatal screening of mother: Secondary | ICD-10-CM | POA: Diagnosis not present

## 2015-07-18 DIAGNOSIS — Z3403 Encounter for supervision of normal first pregnancy, third trimester: Secondary | ICD-10-CM | POA: Diagnosis not present

## 2015-07-18 DIAGNOSIS — Z23 Encounter for immunization: Secondary | ICD-10-CM

## 2015-07-18 LAB — CBC
HEMATOCRIT: 34.5 % — AB (ref 35.0–45.0)
Hemoglobin: 11.5 g/dL — ABNORMAL LOW (ref 11.7–15.5)
MCH: 32.8 pg (ref 27.0–33.0)
MCHC: 33.3 g/dL (ref 32.0–36.0)
MCV: 98.3 fL (ref 80.0–100.0)
MPV: 9.2 fL (ref 7.5–12.5)
PLATELETS: 197 10*3/uL (ref 140–400)
RBC: 3.51 MIL/uL — AB (ref 3.80–5.10)
RDW: 13.1 % (ref 11.0–15.0)
WBC: 6.2 10*3/uL (ref 3.8–10.8)

## 2015-07-18 NOTE — Patient Instructions (Signed)
Third Trimester of Pregnancy The third trimester is from week 29 through week 42, months 7 through 9. The third trimester is a time when the fetus is growing rapidly. At the end of the ninth month, the fetus is about 20 inches in length and weighs 6-10 pounds.  BODY CHANGES Your body goes through many changes during pregnancy. The changes vary from woman to woman.   Your weight will continue to increase. You can expect to gain 25-35 pounds (11-16 kg) by the end of the pregnancy.  You may begin to get stretch marks on your hips, abdomen, and breasts.  You may urinate more often because the fetus is moving lower into your pelvis and pressing on your bladder.  You may develop or continue to have heartburn as a result of your pregnancy.  You may develop constipation because certain hormones are causing the muscles that push waste through your intestines to slow down.  You may develop hemorrhoids or swollen, bulging veins (varicose veins).  You may have pelvic pain because of the weight gain and pregnancy hormones relaxing your joints between the bones in your pelvis. Backaches may result from overexertion of the muscles supporting your posture.  You may have changes in your hair. These can include thickening of your hair, rapid growth, and changes in texture. Some women also have hair loss during or after pregnancy, or hair that feels dry or thin. Your hair will most likely return to normal after your baby is born.  Your breasts will continue to grow and be tender. A yellow discharge may leak from your breasts called colostrum.  Your belly button may stick out.  You may feel short of breath because of your expanding uterus.  You may notice the fetus "dropping," or moving lower in your abdomen.  You may have a bloody mucus discharge. This usually occurs a few days to a week before labor begins.  Your cervix becomes thin and soft (effaced) near your due date. WHAT TO EXPECT AT YOUR  PRENATAL EXAMS  You will have prenatal exams every 2 weeks until week 36. Then, you will have weekly prenatal exams. During a routine prenatal visit:  You will be weighed to make sure you and the fetus are growing normally.  Your blood pressure is taken.  Your abdomen will be measured to track your baby's growth.  The fetal heartbeat will be listened to.  Any test results from the previous visit will be discussed.  You may have a cervical check near your due date to see if you have effaced. At around 36 weeks, your caregiver will check your cervix. At the same time, your caregiver will also perform a test on the secretions of the vaginal tissue. This test is to determine if a type of bacteria, Group B streptococcus, is present. Your caregiver will explain this further. Your caregiver may ask you:  What your birth plan is.  How you are feeling.  If you are feeling the baby move.  If you have had any abnormal symptoms, such as leaking fluid, bleeding, severe headaches, or abdominal cramping.  If you are using any tobacco products, including cigarettes, chewing tobacco, and electronic cigarettes.  If you have any questions. Other tests or screenings that may be performed during your third trimester include:  Blood tests that check for low iron levels (anemia).  Fetal testing to check the health, activity level, and growth of the fetus. Testing is done if you have certain medical conditions or if   there are problems during the pregnancy.  HIV (human immunodeficiency virus) testing. If you are at high risk, you may be screened for HIV during your third trimester of pregnancy. FALSE LABOR You may feel small, irregular contractions that eventually go away. These are called Braxton Hicks contractions, or false labor. Contractions may last for hours, days, or even weeks before true labor sets in. If contractions come at regular intervals, intensify, or become painful, it is best to be seen  by your caregiver.  SIGNS OF LABOR   Menstrual-like cramps.  Contractions that are 5 minutes apart or less.  Contractions that start on the top of the uterus and spread down to the lower abdomen and back.  A sense of increased pelvic pressure or back pain.  A watery or bloody mucus discharge that comes from the vagina. If you have any of these signs before the 37th week of pregnancy, call your caregiver right away. You need to go to the hospital to get checked immediately. HOME CARE INSTRUCTIONS   Avoid all smoking, herbs, alcohol, and unprescribed drugs. These chemicals affect the formation and growth of the baby.  Do not use any tobacco products, including cigarettes, chewing tobacco, and electronic cigarettes. If you need help quitting, ask your health care provider. You may receive counseling support and other resources to help you quit.  Follow your caregiver's instructions regarding medicine use. There are medicines that are either safe or unsafe to take during pregnancy.  Exercise only as directed by your caregiver. Experiencing uterine cramps is a good sign to stop exercising.  Continue to eat regular, healthy meals.  Wear a good support bra for breast tenderness.  Do not use hot tubs, steam rooms, or saunas.  Wear your seat belt at all times when driving.  Avoid raw meat, uncooked cheese, cat litter boxes, and soil used by cats. These carry germs that can cause birth defects in the baby.  Take your prenatal vitamins.  Take 1500-2000 mg of calcium daily starting at the 20th week of pregnancy until you deliver your baby.  Try taking a stool softener (if your caregiver approves) if you develop constipation. Eat more high-fiber foods, such as fresh vegetables or fruit and whole grains. Drink plenty of fluids to keep your urine clear or pale yellow.  Take warm sitz baths to soothe any pain or discomfort caused by hemorrhoids. Use hemorrhoid cream if your caregiver  approves.  If you develop varicose veins, wear support hose. Elevate your feet for 15 minutes, 3-4 times a day. Limit salt in your diet.  Avoid heavy lifting, wear low heal shoes, and practice good posture.  Rest a lot with your legs elevated if you have leg cramps or low back pain.  Visit your dentist if you have not gone during your pregnancy. Use a soft toothbrush to brush your teeth and be gentle when you floss.  A sexual relationship may be continued unless your caregiver directs you otherwise.  Do not travel far distances unless it is absolutely necessary and only with the approval of your caregiver.  Take prenatal classes to understand, practice, and ask questions about the labor and delivery.  Make a trial run to the hospital.  Pack your hospital bag.  Prepare the baby's nursery.  Continue to go to all your prenatal visits as directed by your caregiver. SEEK MEDICAL CARE IF:  You are unsure if you are in labor or if your water has broken.  You have dizziness.  You have   mild pelvic cramps, pelvic pressure, or nagging pain in your abdominal area.  You have persistent nausea, vomiting, or diarrhea.  You have a bad smelling vaginal discharge.  You have pain with urination. SEEK IMMEDIATE MEDICAL CARE IF:   You have a fever.  You are leaking fluid from your vagina.  You have spotting or bleeding from your vagina.  You have severe abdominal cramping or pain.  You have rapid weight loss or gain.  You have shortness of breath with chest pain.  You notice sudden or extreme swelling of your face, hands, ankles, feet, or legs.  You have not felt your baby move in over an hour.  You have severe headaches that do not go away with medicine.  You have vision changes.   This information is not intended to replace advice given to you by your health care provider. Make sure you discuss any questions you have with your health care provider.   Document Released:  02/19/2001 Document Revised: 03/18/2014 Document Reviewed: 04/28/2012 Elsevier Interactive Patient Education 2016 [employer]  Breastfeeding Deciding to breastfeed is one of the best choices you can make for you and your baby. A change in hormones during pregnancy causes your breast tissue to grow and increases the number and size of your milk ducts. These hormones also allow proteins, sugars, and fats from your blood supply to make breast milk in your milk-producing glands. Hormones prevent breast milk from being released before your baby is born as well as prompt milk flow after birth. Once breastfeeding has begun, thoughts of your baby, as well as his or her sucking or crying, can stimulate the release of milk from your milk-producing glands.  BENEFITS OF BREASTFEEDING For Your Baby  Your first milk (colostrum) helps your baby's digestive system function better.  There are antibodies in your milk that help your baby fight off infections.  Your baby has a lower incidence of asthma, allergies, and sudden infant death syndrome.  The nutrients in breast milk are better for your baby than infant formulas and are designed uniquely for your baby's needs.  Breast milk improves your baby's brain development.  Your baby is less likely to develop other conditions, such as childhood obesity, asthma, or type 2 diabetes mellitus. For You  Breastfeeding helps to create a very special bond between you and your baby.  Breastfeeding is convenient. Breast milk is always available at the correct temperature and costs nothing.  Breastfeeding helps to burn calories and helps you lose the weight gained during pregnancy.  Breastfeeding makes your uterus contract to its prepregnancy size faster and slows bleeding (lochia) after you give birth.   Breastfeeding helps to lower your risk of developing type 2 diabetes mellitus, osteoporosis, and breast or ovarian cancer later in life. SIGNS THAT YOUR BABY IS  HUNGRY Early Signs of Hunger  Increased alertness or activity.  Stretching.  Movement of the head from side to side.  Movement of the head and opening of the mouth when the corner of the mouth or cheek is stroked (rooting).  Increased sucking sounds, smacking lips, cooing, sighing, or squeaking.  Hand-to-mouth movements.  Increased sucking of fingers or hands. Late Signs of Hunger  Fussing.  Intermittent crying. Extreme Signs of Hunger Signs of extreme hunger will require calming and consoling before your baby will be able to breastfeed successfully. Do not wait for the following signs of extreme hunger to occur before you initiate breastfeeding:  Restlessness.  A loud, strong cry.  Screaming.   BREASTFEEDING BASICS Breastfeeding Initiation  Find a comfortable place to sit or lie down, with your neck and back well supported.  Place a pillow or rolled up blanket under your baby to bring him or her to the level of your breast (if you are seated). Nursing pillows are specially designed to help support your arms and your baby while you breastfeed.  Make sure that your baby's abdomen is facing your abdomen.  Gently massage your breast. With your fingertips, massage from your chest wall toward your nipple in a circular motion. This encourages milk flow. You may need to continue this action during the feeding if your milk flows slowly.  Support your breast with 4 fingers underneath and your thumb above your nipple. Make sure your fingers are well away from your nipple and your baby's mouth.  Stroke your baby's lips gently with your finger or nipple.  When your baby's mouth is open wide enough, quickly bring your baby to your breast, placing your entire nipple and as much of the colored area around your nipple (areola) as possible into your baby's mouth.  More areola should be visible above your baby's upper lip than below the lower lip.  Your baby's tongue should be between his  or her lower gum and your breast.  Ensure that your baby's mouth is correctly positioned around your nipple (latched). Your baby's lips should create a seal on your breast and be turned out (everted).  It is common for your baby to suck about 2-3 minutes in order to start the flow of breast milk. Latching Teaching your baby how to latch on to your breast properly is very important. An improper latch can cause nipple pain and decreased milk supply for you and poor weight gain in your baby. Also, if your baby is not latched onto your nipple properly, he or she may swallow some air during feeding. This can make your baby fussy. Burping your baby when you switch breasts during the feeding can help to get rid of the air. However, teaching your baby to latch on properly is still the best way to prevent fussiness from swallowing air while breastfeeding. Signs that your baby has successfully latched on to your nipple:  Silent tugging or silent sucking, without causing you pain.  Swallowing heard between every 3-4 sucks.  Muscle movement above and in front of his or her ears while sucking. Signs that your baby has not successfully latched on to nipple:  Sucking sounds or smacking sounds from your baby while breastfeeding.  Nipple pain. If you think your baby has not latched on correctly, slip your finger into the corner of your baby's mouth to break the suction and place it between your baby's gums. Attempt breastfeeding initiation again. Signs of Successful Breastfeeding Signs from your baby:  A gradual decrease in the number of sucks or complete cessation of sucking.  Falling asleep.  Relaxation of his or her body.  Retention of a small amount of milk in his or her mouth.  Letting go of your breast by himself or herself. Signs from you:  Breasts that have increased in firmness, weight, and size 1-3 hours after feeding.  Breasts that are softer immediately after  breastfeeding.  Increased milk volume, as well as a change in milk consistency and color by the fifth day of breastfeeding.  Nipples that are not sore, cracked, or bleeding. Signs That Your Baby is Getting Enough Milk  Wetting at least 3 diapers in a 24-hour period.   The urine should be clear and pale yellow by age 5 days.  At least 3 stools in a 24-hour period by age 5 days. The stool should be soft and yellow.  At least 3 stools in a 24-hour period by age 7 days. The stool should be seedy and yellow.  No loss of weight greater than 10% of birth weight during the first 3 days of age.  Average weight gain of 4-7 ounces (113-198 g) per week after age 4 days.  Consistent daily weight gain by age 5 days, without weight loss after the age of 2 weeks. After a feeding, your baby may spit up a small amount. This is common. BREASTFEEDING FREQUENCY AND DURATION Frequent feeding will help you make more milk and can prevent sore nipples and breast engorgement. Breastfeed when you feel the need to reduce the fullness of your breasts or when your baby shows signs of hunger. This is called "breastfeeding on demand." Avoid introducing a pacifier to your baby while you are working to establish breastfeeding (the first 4-6 weeks after your baby is born). After this time you may choose to use a pacifier. Research has shown that pacifier use during the first year of a baby's life decreases the risk of sudden infant death syndrome (SIDS). Allow your baby to feed on each breast as long as he or she wants. Breastfeed until your baby is finished feeding. When your baby unlatches or falls asleep while feeding from the first breast, offer the second breast. Because newborns are often sleepy in the first few weeks of life, you may need to awaken your baby to get him or her to feed. Breastfeeding times will vary from baby to baby. However, the following rules can serve as a guide to help you ensure that your baby is  properly fed:  Newborns (babies 4 weeks of age or younger) may breastfeed every 1-3 hours.  Newborns should not go longer than 3 hours during the day or 5 hours during the night without breastfeeding.  You should breastfeed your baby a minimum of 8 times in a 24-hour period until you begin to introduce solid foods to your baby at around 6 months of age. BREAST MILK PUMPING Pumping and storing breast milk allows you to ensure that your baby is exclusively fed your breast milk, even at times when you are unable to breastfeed. This is especially important if you are going back to work while you are still breastfeeding or when you are not able to be present during feedings. Your lactation consultant can give you guidelines on how long it is safe to store breast milk. A breast pump is a machine that allows you to pump milk from your breast into a sterile bottle. The pumped breast milk can then be stored in a refrigerator or freezer. Some breast pumps are operated by hand, while others use electricity. Ask your lactation consultant which type will work best for you. Breast pumps can be purchased, but some hospitals and breastfeeding support groups lease breast pumps on a monthly basis. A lactation consultant can teach you how to hand express breast milk, if you prefer not to use a pump. CARING FOR YOUR BREASTS WHILE YOU BREASTFEED Nipples can become dry, cracked, and sore while breastfeeding. The following recommendations can help keep your breasts moisturized and healthy:  Avoid using soap on your nipples.  Wear a supportive bra. Although not required, special nursing bras and tank tops are designed to allow access to your   breasts for breastfeeding without taking off your entire bra or top. Avoid wearing underwire-style bras or extremely tight bras.  Air dry your nipples for 3-4minutes after each feeding.  Use only cotton bra pads to absorb leaked breast milk. Leaking of breast milk between feedings  is normal.  Use lanolin on your nipples after breastfeeding. Lanolin helps to maintain your skin's normal moisture barrier. If you use pure lanolin, you do not need to wash it off before feeding your baby again. Pure lanolin is not toxic to your baby. You may also hand express a few drops of breast milk and gently massage that milk into your nipples and allow the milk to air dry. In the first few weeks after giving birth, some women experience extremely full breasts (engorgement). Engorgement can make your breasts feel heavy, warm, and tender to the touch. Engorgement peaks within 3-5 days after you give birth. The following recommendations can help ease engorgement:  Completely empty your breasts while breastfeeding or pumping. You may want to start by applying warm, moist heat (in the shower or with warm water-soaked hand towels) just before feeding or pumping. This increases circulation and helps the milk flow. If your baby does not completely empty your breasts while breastfeeding, pump any extra milk after he or she is finished.  Wear a snug bra (nursing or regular) or tank top for 1-2 days to signal your body to slightly decrease milk production.  Apply ice packs to your breasts, unless this is too uncomfortable for you.  Make sure that your baby is latched on and positioned properly while breastfeeding. If engorgement persists after 48 hours of following these recommendations, contact your health care provider or a lactation consultant. OVERALL HEALTH CARE RECOMMENDATIONS WHILE BREASTFEEDING  Eat healthy foods. Alternate between meals and snacks, eating 3 of each per day. Because what you eat affects your breast milk, some of the foods may make your baby more irritable than usual. Avoid eating these foods if you are sure that they are negatively affecting your baby.  Drink milk, fruit juice, and water to satisfy your thirst (about 10 glasses a day).  Rest often, relax, and continue to take  your prenatal vitamins to prevent fatigue, stress, and anemia.  Continue breast self-awareness checks.  Avoid chewing and smoking tobacco. Chemicals from cigarettes that pass into breast milk and exposure to secondhand smoke may harm your baby.  Avoid alcohol and drug use, including marijuana. Some medicines that may be harmful to your baby can pass through breast milk. It is important to ask your health care provider before taking any medicine, including all over-the-counter and prescription medicine as well as vitamin and herbal supplements. It is possible to become pregnant while breastfeeding. If birth control is desired, ask your health care provider about options that will be safe for your baby. SEEK MEDICAL CARE IF:  You feel like you want to stop breastfeeding or have become frustrated with breastfeeding.  You have painful breasts or nipples.  Your nipples are cracked or bleeding.  Your breasts are red, tender, or warm.  You have a swollen area on either breast.  You have a fever or chills.  You have nausea or vomiting.  You have drainage other than breast milk from your nipples.  Your breasts do not become full before feedings by the fifth day after you give birth.  You feel sad and depressed.  Your baby is too sleepy to eat well.  Your baby is having trouble sleeping.     Your baby is wetting less than 3 diapers in a 24-hour period.  Your baby has less than 3 stools in a 24-hour period.  Your baby's skin or the white part of his or her eyes becomes yellow.   Your baby is not gaining weight by 5 days of age. SEEK IMMEDIATE MEDICAL CARE IF:  Your baby is overly tired (lethargic) and does not want to wake up and feed.  Your baby develops an unexplained fever.   This information is not intended to replace advice given to you by your health care provider. Make sure you discuss any questions you have with your health care provider.   Document Released: 02/25/2005  Document Revised: 11/16/2014 Document Reviewed: 08/19/2012 Elsevier Interactive Patient Education 2016 [employer]  

## 2015-07-18 NOTE — Progress Notes (Signed)
Subjective:  Christina Blankenship is a 35 y.o. G1P0 at 1276w3d being seen today for ongoing prenatal care.  She is currently monitored for the following issues for this low-risk pregnancy and has Supervision of normal first pregnancy, antepartum and AMA (advanced maternal age) multigravida 35+ on her problem list.  Patient reports pelvic pain x 1, 2 wks ago. Has resolved. No further complaints..  Contractions: Not present. Vag. Bleeding: None.  Movement: Present. Denies leaking of fluid.   The following portions of the patient's history were reviewed and updated as appropriate: allergies, current medications, past family history, past medical history, past social history, past surgical history and problem list. Problem list updated.  Objective:   Filed Vitals:   07/18/15 1551  BP: 100/63  Pulse: 85  Weight: 130 lb (58.968 kg)    Fetal Status: Fetal Heart Rate (bpm): 130 Fundal Height: 29 cm Movement: Present     General:  Alert, oriented and cooperative. Patient is in no acute distress.  Skin: Skin is warm and dry. No rash noted.   Cardiovascular: Normal heart rate noted  Respiratory: Normal respiratory effort, no problems with respiration noted  Abdomen: Soft, gravid, appropriate for gestational age. Pain/Pressure: Absent     Pelvic: Vag. Bleeding: None Vag D/C Character: Thin   Cervical exam deferred        Extremities: Normal range of motion.  Edema: Trace  Mental Status: Normal mood and affect. Normal behavior. Normal judgment and thought content.   Urinalysis: Urine Protein: Negative Urine Glucose: Negative  Assessment and Plan:  Pregnancy: G1P0 at 4776w3d  1. Supervision of normal first pregnancy, antepartum, third trimester 28 wk labs today Continue routine prenatal care.  - CBC - Glucose Tolerance, 1 HR (50g) - HIV antibody - RPR - Tdap vaccine greater than or equal to 7yo IM - TSH  Preterm labor symptoms and general obstetric precautions including but not limited to  vaginal bleeding, contractions, leaking of fluid and fetal movement were reviewed in detail with the patient. Please refer to After Visit Summary for other counseling recommendations.  Return in 2 weeks (on 08/01/2015).   Reva Boresanya S Mantaj Chamberlin, MD

## 2015-07-19 LAB — HIV ANTIBODY (ROUTINE TESTING W REFLEX): HIV: NONREACTIVE

## 2015-07-19 LAB — RPR

## 2015-07-19 LAB — TSH: TSH: 1.83 m[IU]/L

## 2015-07-19 LAB — GLUCOSE TOLERANCE, 1 HOUR (50G) W/O FASTING: Glucose, 1 Hr, gestational: 109 mg/dL (ref <140)

## 2015-08-01 ENCOUNTER — Encounter: Payer: BLUE CROSS/BLUE SHIELD | Admitting: Obstetrics & Gynecology

## 2016-01-25 ENCOUNTER — Encounter (HOSPITAL_COMMUNITY): Payer: Self-pay

## 2017-03-18 IMAGING — US US ABDOMEN LIMITED
1 series · 14 of 25 positions shown · non-contrast
Comparison: None.

CLINICAL DATA: Epigastric abdominal pain for 10 days.

EXAM:
US ABDOMEN LIMITED - RIGHT UPPER QUADRANT

[Series 1: us abdomen limited · 0.20mm/px · 14 of 46 slices shown]
[im 1/46]
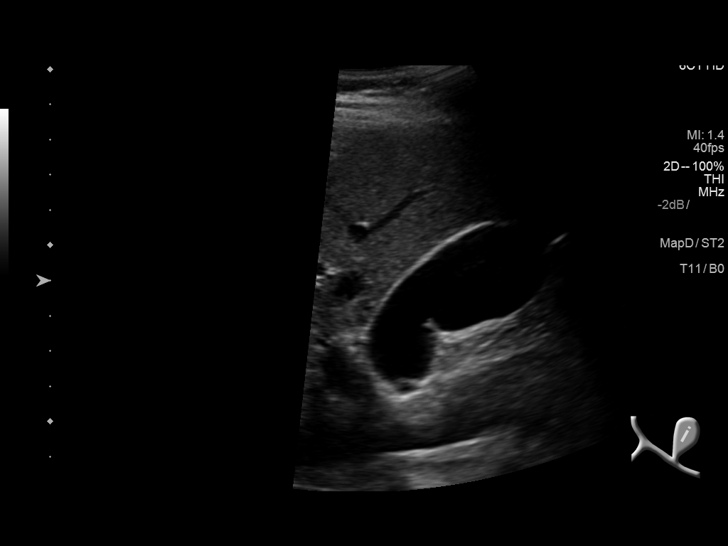
[im 4/46]
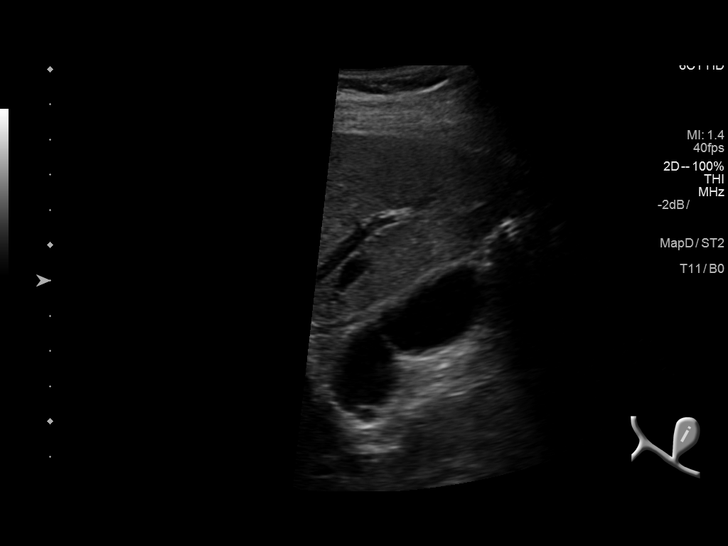
[im 8/46]
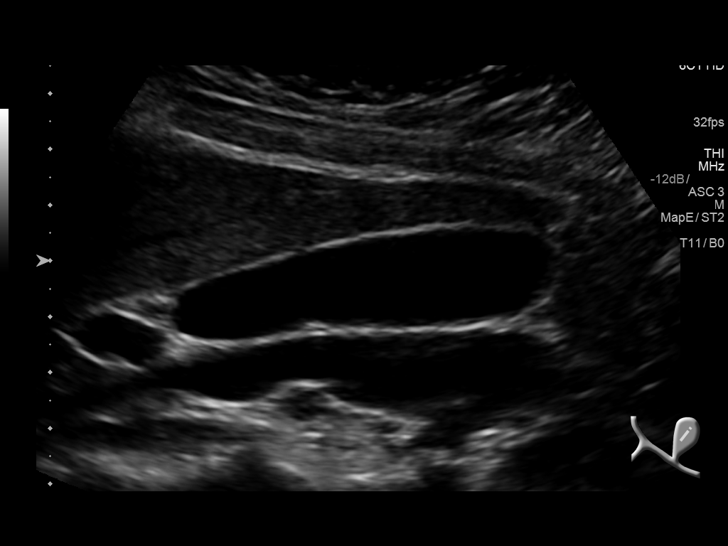
[im 12/46]
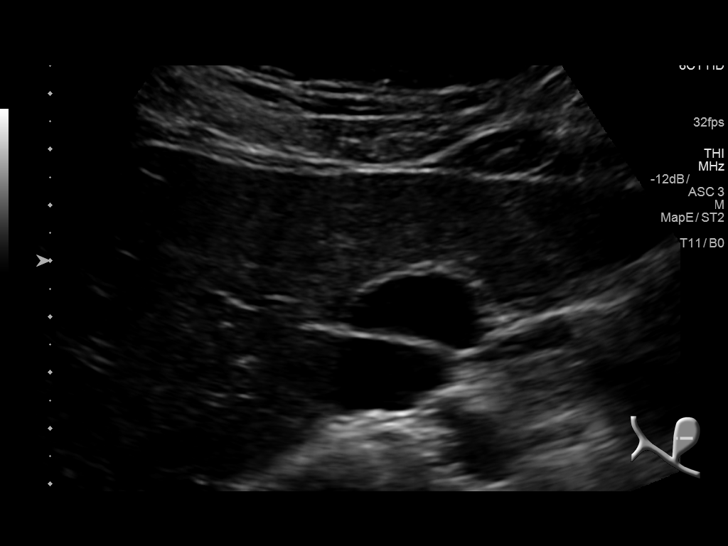
[im 16/46]
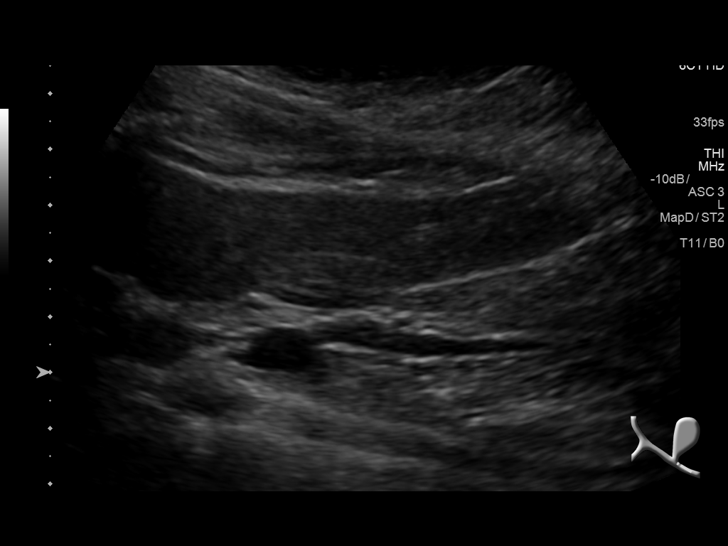
[im 17/46]
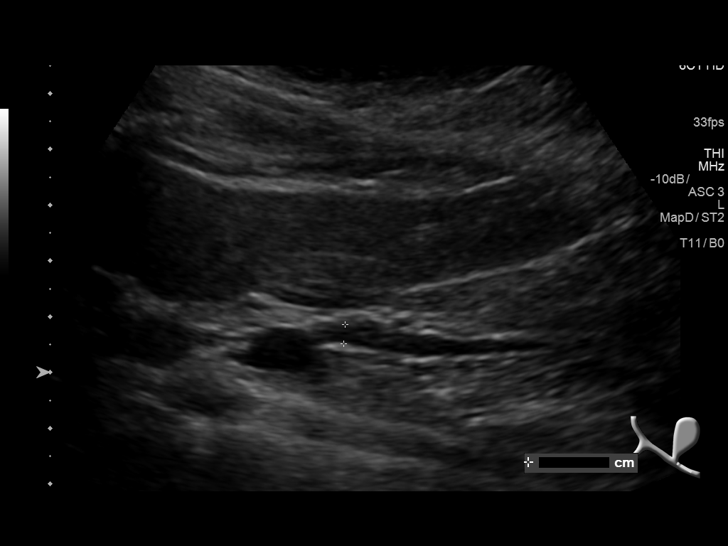
[im 21/46]
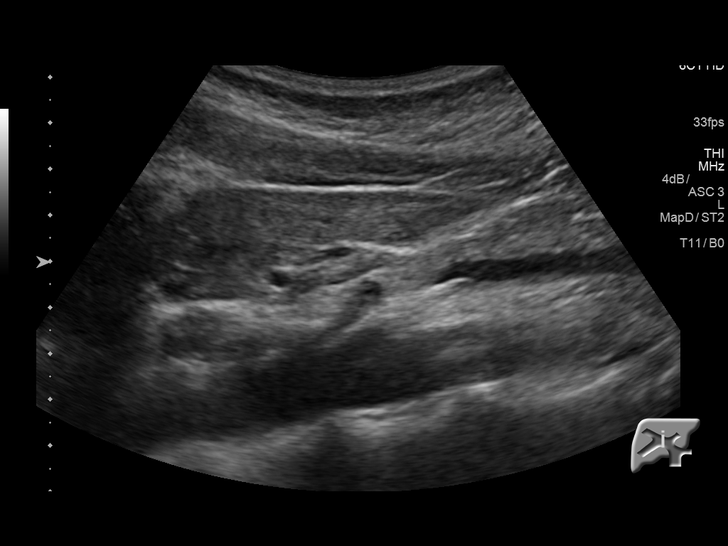
[im 25/46]
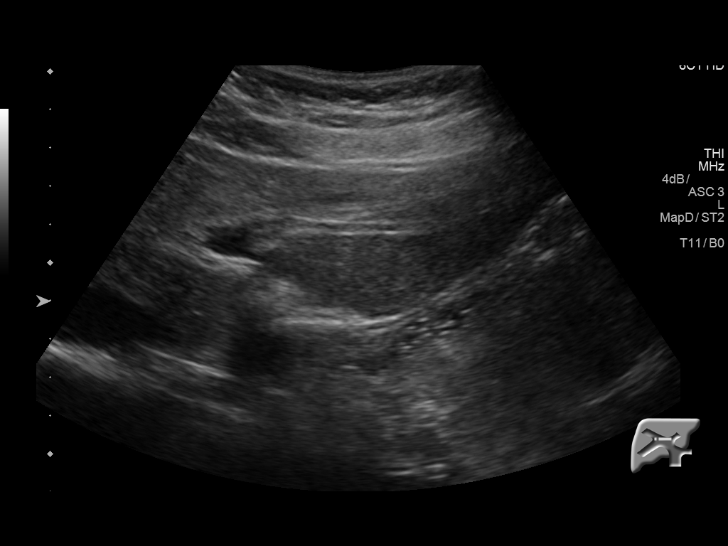
[im 29/46]
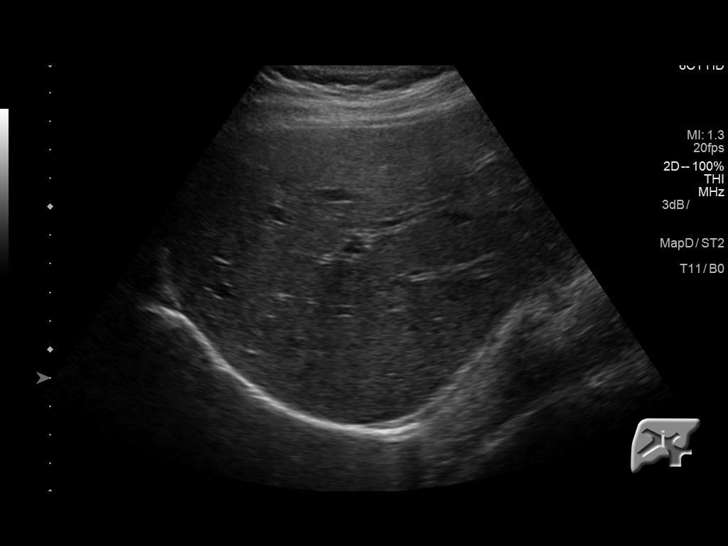
[im 31/46]
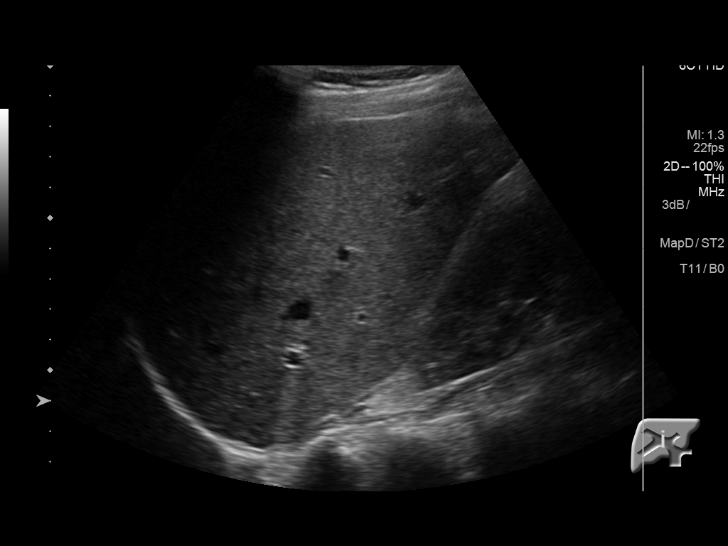
[im 34/46]
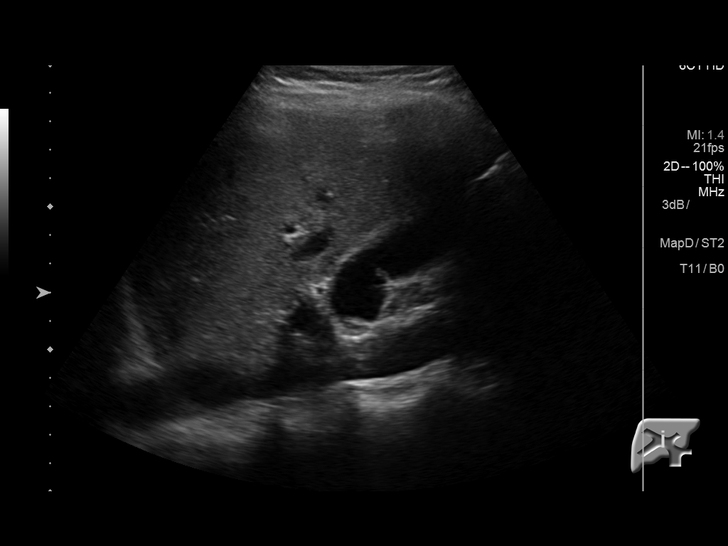
[im 38/46]
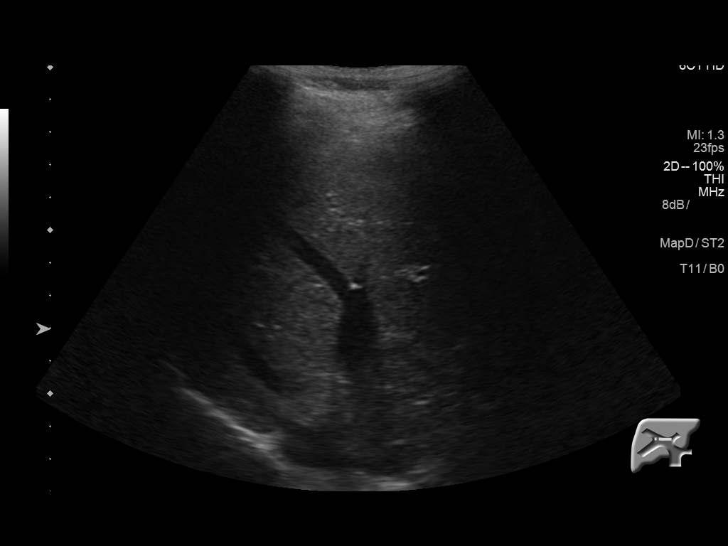
[im 42/46]
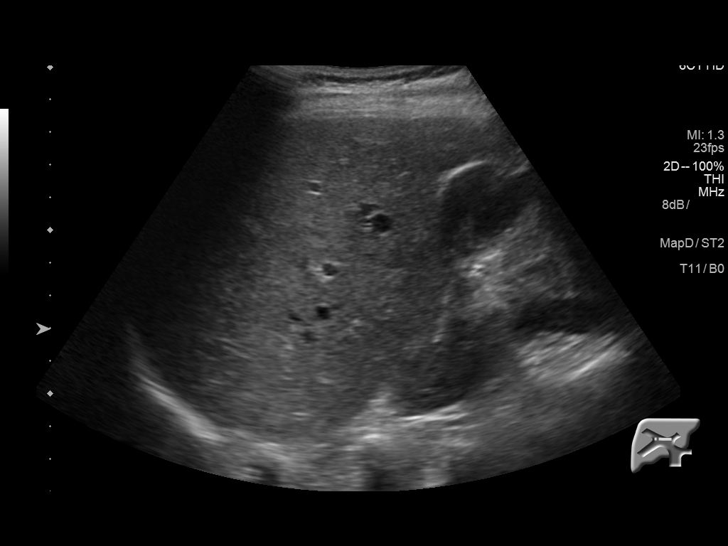
[im 46/46]
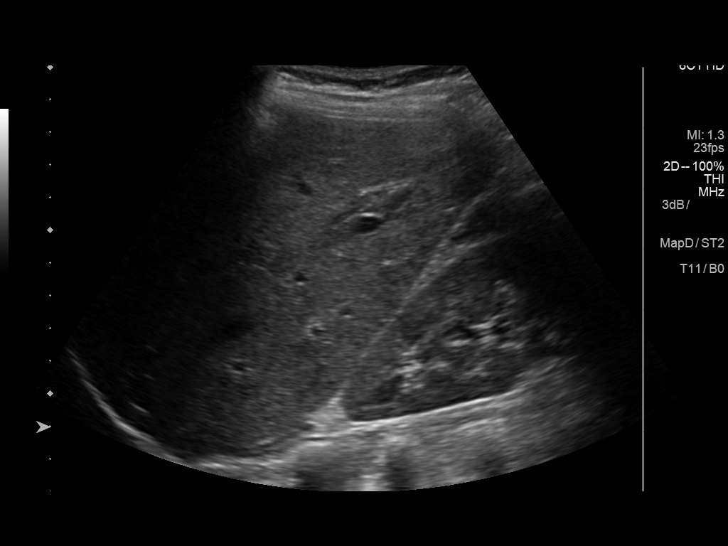

[14 of 25 positions shown; findings below may reference images not displayed]

FINDINGS: Gallbladder:

No gallstones or wall thickening visualized. No sonographic Murphy
sign noted.

Common bile duct:

Diameter: 3.6 mm which is within normal limits.

Liver:

No focal lesion identified. Within normal limits in parenchymal
echogenicity.
IMPRESSION: No abnormality seen in the right upper quadrant of the abdomen.

## 2017-09-21 IMAGING — US US MFM OB DETAIL+14 WK
1 series · 14 of 28 positions shown · non-contrast
Comparison: none

[Series 1: us mfm ob detail+14 wk · 14 of 117 slices shown]
[im 5/117]
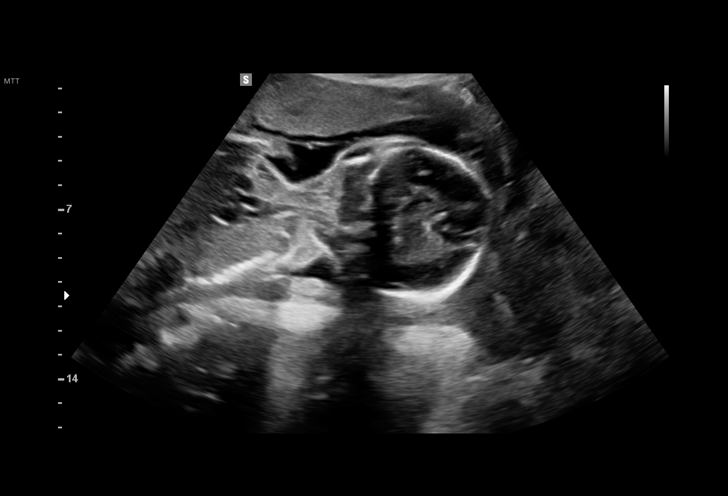
[im 13/117]
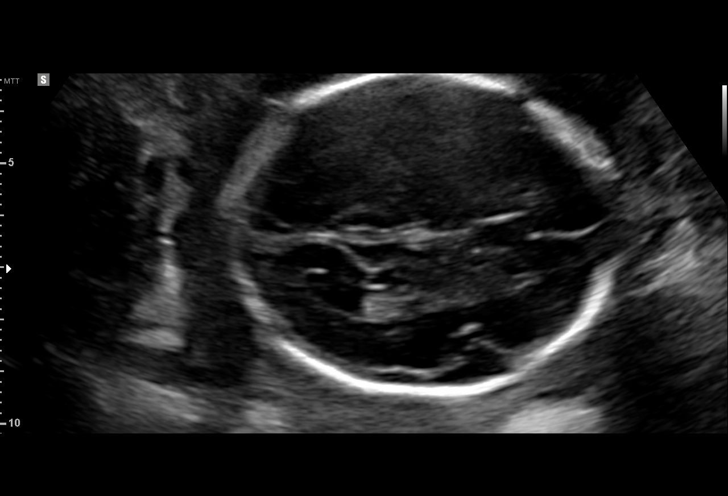
[im 22/117]
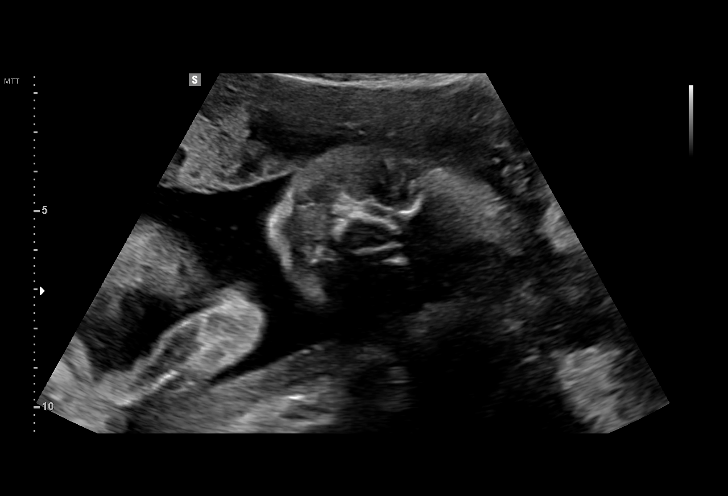
[im 31/117]
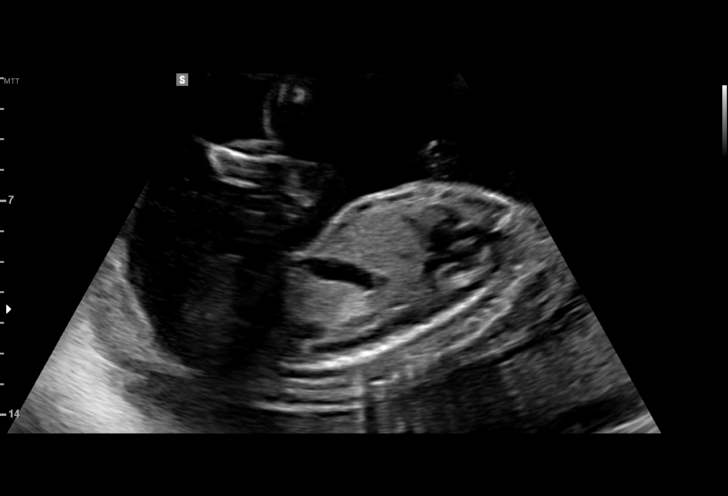
[im 39/117]
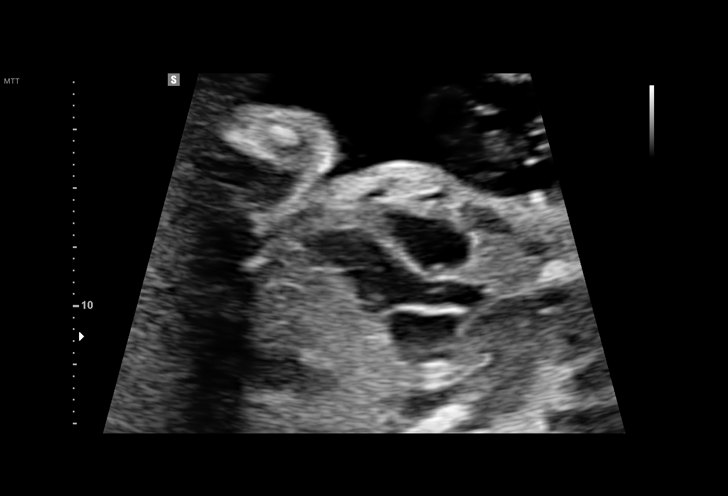
[im 48/117]
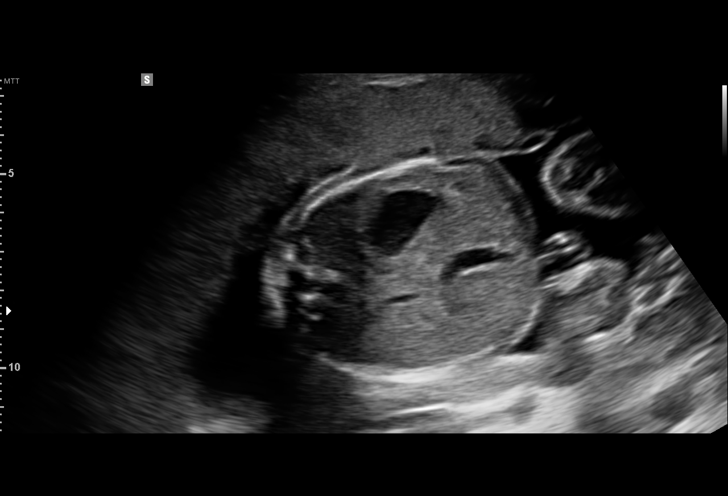
[im 56/117]
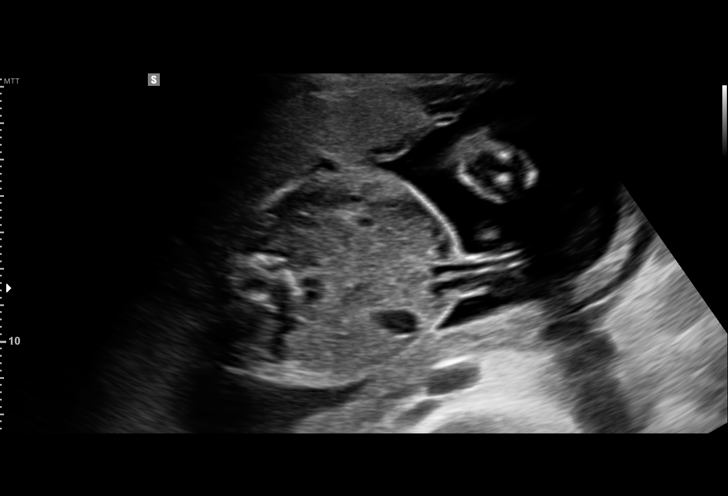
[im 65/117]
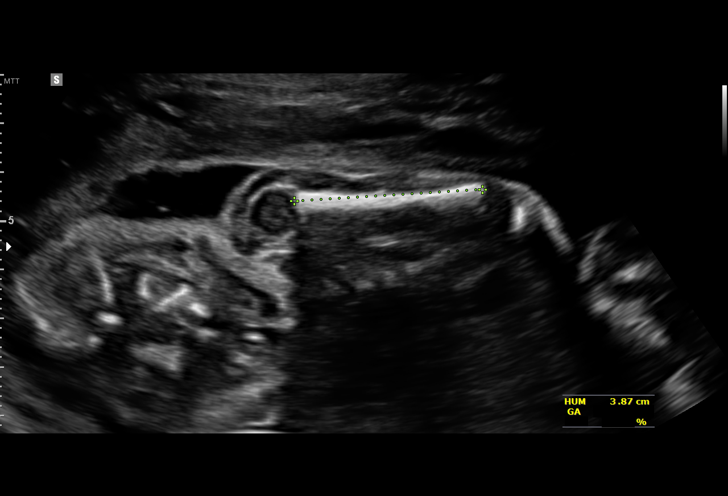
[im 74/117]
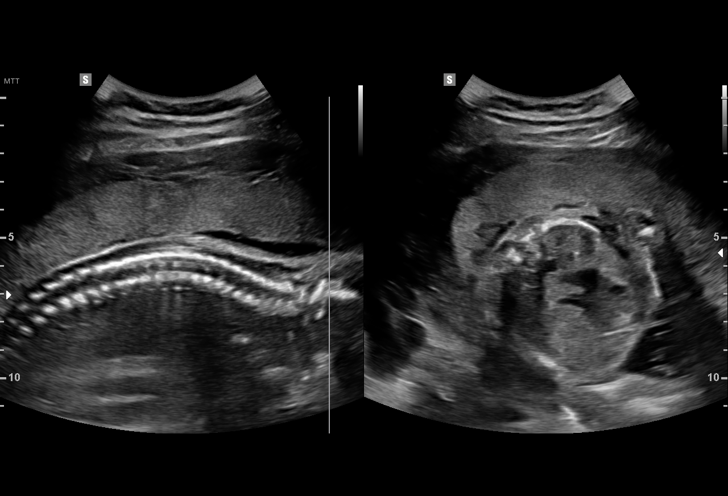
[im 82/117]
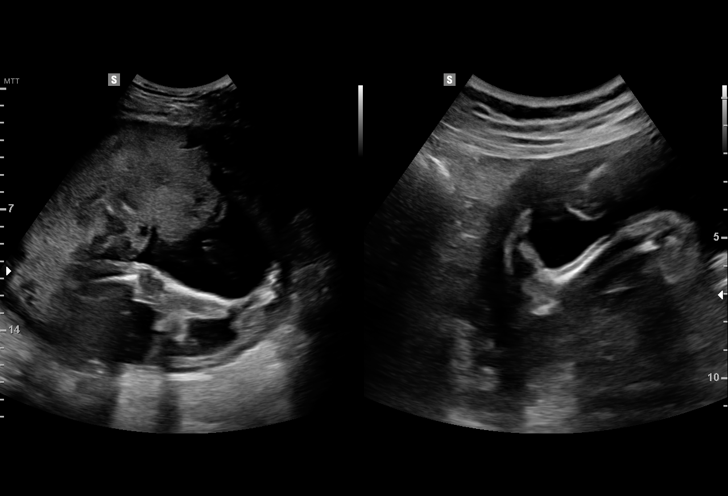
[im 91/117]
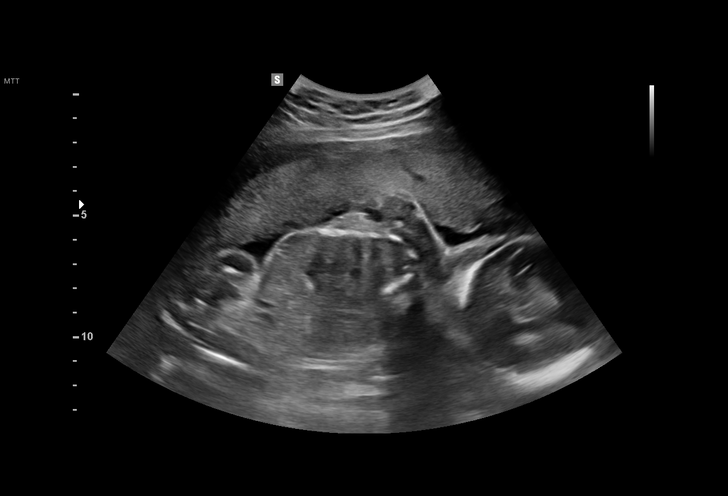
[im 99/117]
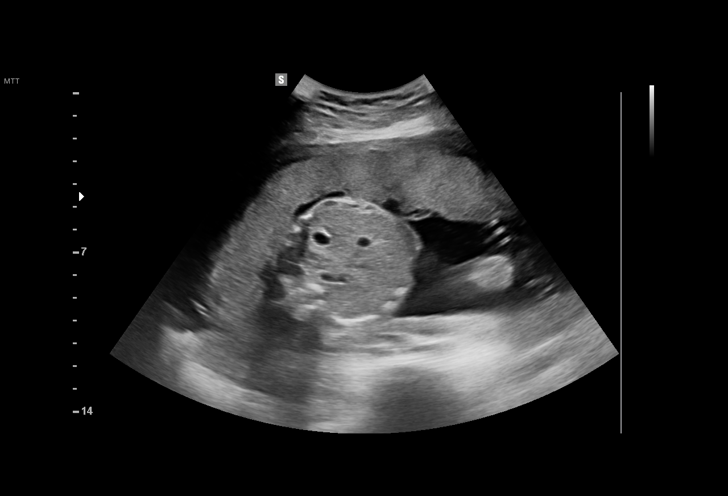
[im 108/117]
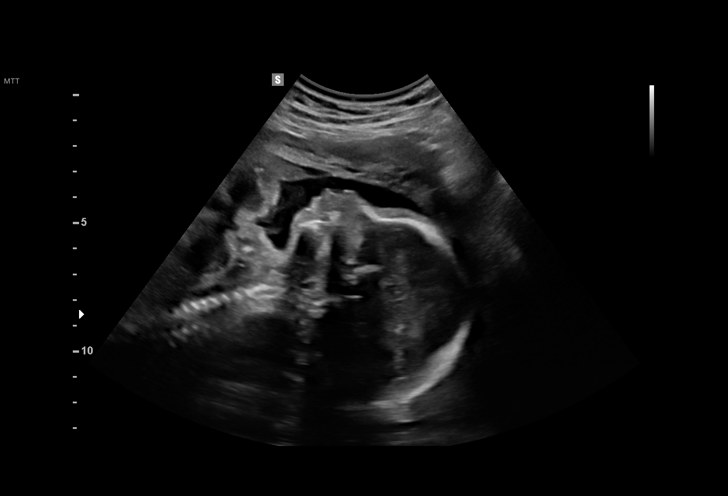
[im 117/117]
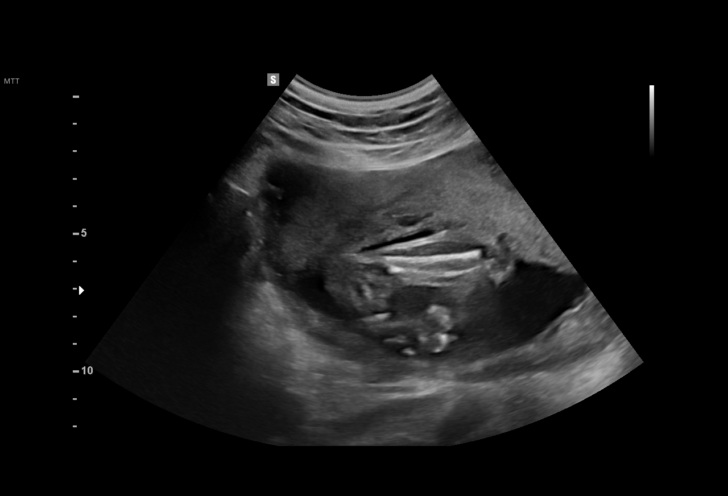

[14 of 28 positions shown; findings below may reference images not displayed]

[facility]

Performed By:     Yoel Tiger BS,       Secondary Phy.:   [[id number]]
Ref. Address:     [REDACTED]

[address]            [id number]      [id number]     [id number]
Indications

23 weeks gestation of pregnancy
Detailed fetal anatomic survey                 Z36
Advanced maternal age primigravida 35+,
second trimester; low risk NIPS
OB History

Gravidity:    1         Term:   0        Prem:   0        SAB:   0
TOP:          0       Ectopic:  0        Living: 0
Fetal Evaluation

Num Of Fetuses:     1
Fetal Heart         147
Rate(bpm):
Cardiac Activity:   Observed
Presentation:       Cephalic
Placenta:           Anterior, above cervical os
P. Cord Insertion:  Visualized, central

Amniotic Fluid
AFI FV:      Subjectively within normal limits
Larg Pckt:     4.9  cm
Biometry

BPD:      62.2  mm     G. Age:  25w 2d                  CI:        79.04   %   70 - 86
FL/HC:      18.1   %   18.7 -
HC:      221.2  mm     G. Age:  24w 1d         50  %    HC/AC:      1.04       1.05 -
AC:       212   mm     G. Age:  25w 6d         92  %    FL/BPD:     64.3   %   71 - 87
FL:         40  mm     G. Age:  22w 6d         16  %    FL/AC:      18.9   %   20 - 24
HUM:      38.5  mm     G. Age:  23w 5d         40  %
CER:      26.3  mm     G. Age:  24w 1d         57  %
CM:        5.8  mm
Est. FW:     707  gm      1 lb 9 oz     67  %
Gestational Age

LMP:           26w 2d       Date:   12/06/14                 EDD:   09/12/15
U/S Today:     24w 4d                                        EDD:   09/24/15
Best:          23w 5d    Det. By:   Early Ultrasound         EDD:   09/30/15
(03/22/15)
Anatomy

Cranium:          Appears normal         Aortic Arch:      Appears normal
Fetal Cavum:      Appears normal         Ductal Arch:      Appears normal
Ventricles:       Appears normal         Diaphragm:        Appears normal
Choroid Plexus:   Appears normal         Stomach:          Appears normal, left
sided
Cerebellum:       Appears normal         Abdomen:          Appears normal
Posterior Fossa:  Appears normal         Abdominal Wall:   Appears nml (cord
insert, abd wall)
Nuchal Fold:      Not applicable (>20    Cord Vessels:     Appears normal (3
wks GA)                                  vessel cord)
Face:             Appears normal         Kidneys:          Appear normal
(orbits and profile)
Lips:             Appears normal         Bladder:          Appears normal
Fetal Thoracic:   Appears normal         Spine:            Appears normal
Heart:            Appears normal         Upper             Visualized
(4CH, axis, and        Extremities:
situs)
RVOT:             Appears normal         Lower             Visualized
Extremities:
LVOT:             Appears normal

Other:  Fetus appears to be a male. Heels and 5th digit visualized. Nasal
bone visualized.
Cervix Uterus Adnexa

Cervix
Length:           3.55  cm.
Normal appearance by transabdominal scan.

Uterus
No abnormality visualized.

Left Ovary
Size(cm)     2.59  x    2.42   x  1.34      Vol(ml):
Within normal limits. No adnexal mass visualized.

Right Ovary
Size(cm)     2.13  x    2.05   x  1.96      Vol(ml):
Within normal limits. No adnexal mass visualized.

Cul De Sac:   No free fluid seen.

Adnexa:       No abnormality visualized.
Impression

SIUP at 23+5 weeks
Normal detailed fetal anatomy
Normal amniotic fluid volume
Measurements consistent with early US; EFW at the 67th
%tile
Recommendations

Follow-up as clinically indicated
